# Patient Record
Sex: Male | Born: 1955 | Race: White | Hispanic: No | Smoking: Former smoker
Health system: Southern US, Community
[De-identification: ages and names within clinical notes are randomized; demographics above are authoritative.]

## PROBLEM LIST (undated history)

## (undated) DIAGNOSIS — IMO0001 Reserved for inherently not codable concepts without codable children: Secondary | ICD-10-CM

## (undated) DIAGNOSIS — Z87440 Personal history of urinary (tract) infections: Secondary | ICD-10-CM

## (undated) DIAGNOSIS — Z87442 Personal history of urinary calculi: Secondary | ICD-10-CM

## (undated) DIAGNOSIS — G245 Blepharospasm: Secondary | ICD-10-CM

## (undated) DIAGNOSIS — R238 Other skin changes: Secondary | ICD-10-CM

## (undated) DIAGNOSIS — R350 Frequency of micturition: Secondary | ICD-10-CM

## (undated) DIAGNOSIS — E119 Type 2 diabetes mellitus without complications: Secondary | ICD-10-CM

## (undated) DIAGNOSIS — E785 Hyperlipidemia, unspecified: Secondary | ICD-10-CM

## (undated) DIAGNOSIS — M1990 Unspecified osteoarthritis, unspecified site: Secondary | ICD-10-CM

## (undated) DIAGNOSIS — K219 Gastro-esophageal reflux disease without esophagitis: Secondary | ICD-10-CM

## (undated) DIAGNOSIS — M2550 Pain in unspecified joint: Secondary | ICD-10-CM

## (undated) DIAGNOSIS — N400 Enlarged prostate without lower urinary tract symptoms: Secondary | ICD-10-CM

## (undated) DIAGNOSIS — T8859XA Other complications of anesthesia, initial encounter: Secondary | ICD-10-CM

## (undated) DIAGNOSIS — T4145XA Adverse effect of unspecified anesthetic, initial encounter: Secondary | ICD-10-CM

## (undated) DIAGNOSIS — H5440 Blindness, one eye, unspecified eye: Secondary | ICD-10-CM

## (undated) HISTORY — DX: Unspecified osteoarthritis, unspecified site: M19.90

## (undated) HISTORY — DX: Type 2 diabetes mellitus without complications: E11.9

## (undated) HISTORY — PX: OTHER SURGICAL HISTORY: SHX169

## (undated) HISTORY — DX: Hyperlipidemia, unspecified: E78.5

## (undated) HISTORY — DX: Gastro-esophageal reflux disease without esophagitis: K21.9

## (undated) HISTORY — DX: Reserved for inherently not codable concepts without codable children: IMO0001

## (undated) HISTORY — PX: BLADDER SURGERY: SHX569

---

## 1998-08-22 HISTORY — PX: OTHER SURGICAL HISTORY: SHX169

## 2003-08-23 HISTORY — PX: BRAIN SURGERY: SHX531

## 2007-08-23 HISTORY — PX: TOTAL HIP ARTHROPLASTY: SHX124

## 2014-09-01 ENCOUNTER — Emergency Department: Payer: Self-pay | Admitting: Emergency Medicine

## 2014-09-01 LAB — BASIC METABOLIC PANEL
Anion Gap: 8 (ref 7–16)
BUN: 14 mg/dL (ref 7–18)
CALCIUM: 8.4 mg/dL — AB (ref 8.5–10.1)
CREATININE: 0.87 mg/dL (ref 0.60–1.30)
Chloride: 106 mmol/L (ref 98–107)
Co2: 25 mmol/L (ref 21–32)
GLUCOSE: 156 mg/dL — AB (ref 65–99)
Osmolality: 281 (ref 275–301)
POTASSIUM: 3.8 mmol/L (ref 3.5–5.1)
SODIUM: 139 mmol/L (ref 136–145)

## 2014-09-01 LAB — CBC WITH DIFFERENTIAL/PLATELET
Basophil #: 0.1 10*3/uL (ref 0.0–0.1)
Basophil %: 0.9 %
Eosinophil #: 0.4 10*3/uL (ref 0.0–0.7)
Eosinophil %: 3.8 %
HCT: 47.5 % (ref 40.0–52.0)
HGB: 16 g/dL (ref 13.0–18.0)
LYMPHS ABS: 2.8 10*3/uL (ref 1.0–3.6)
Lymphocyte %: 29.4 %
MCH: 29.1 pg (ref 26.0–34.0)
MCHC: 33.6 g/dL (ref 32.0–36.0)
MCV: 86 fL (ref 80–100)
Monocyte #: 0.7 x10 3/mm (ref 0.2–1.0)
Monocyte %: 7.4 %
Neutrophil #: 5.5 10*3/uL (ref 1.4–6.5)
Neutrophil %: 58.5 %
PLATELETS: 174 10*3/uL (ref 150–440)
RBC: 5.5 10*6/uL (ref 4.40–5.90)
RDW: 13.5 % (ref 11.5–14.5)
WBC: 9.4 10*3/uL (ref 3.8–10.6)

## 2014-12-11 ENCOUNTER — Ambulatory Visit
Admit: 2014-12-11 | Disposition: A | Discharge status: Home / Self Care | Payer: Self-pay | Attending: Pain Medicine | Admitting: Pain Medicine

## 2015-10-06 ENCOUNTER — Encounter: Payer: Self-pay | Admitting: Family Medicine

## 2015-10-07 ENCOUNTER — Encounter: Payer: Self-pay | Admitting: Obstetrics and Gynecology

## 2015-10-07 ENCOUNTER — Ambulatory Visit (INDEPENDENT_AMBULATORY_CARE_PROVIDER_SITE_OTHER): Payer: Medicare Other | Admitting: Obstetrics and Gynecology

## 2015-10-07 VITALS — BP 118/73 | HR 43 | Resp 16 | Ht 67.0 in | Wt 199.8 lb

## 2015-10-07 DIAGNOSIS — N401 Enlarged prostate with lower urinary tract symptoms: Secondary | ICD-10-CM | POA: Diagnosis not present

## 2015-10-07 DIAGNOSIS — R350 Frequency of micturition: Secondary | ICD-10-CM | POA: Diagnosis not present

## 2015-10-07 DIAGNOSIS — N138 Other obstructive and reflux uropathy: Secondary | ICD-10-CM

## 2015-10-07 DIAGNOSIS — Z87442 Personal history of urinary calculi: Secondary | ICD-10-CM

## 2015-10-07 DIAGNOSIS — N390 Urinary tract infection, site not specified: Secondary | ICD-10-CM

## 2015-10-07 LAB — URINALYSIS, COMPLETE
Bilirubin, UA: NEGATIVE
Ketones, UA: NEGATIVE
Nitrite, UA: NEGATIVE
SPEC GRAV UA: 1.025 (ref 1.005–1.030)
UUROB: 0.2 mg/dL (ref 0.2–1.0)
pH, UA: 5.5 (ref 5.0–7.5)

## 2015-10-07 LAB — BLADDER SCAN AMB NON-IMAGING

## 2015-10-07 LAB — MICROSCOPIC EXAMINATION
Bacteria, UA: NONE SEEN
EPITHELIAL CELLS (NON RENAL): NONE SEEN /HPF (ref 0–10)
RBC, UA: NONE SEEN /hpf (ref 0–?)

## 2015-10-07 MED ORDER — TAMSULOSIN HCL 0.4 MG PO CAPS: 0.4000 mg | ORAL_CAPSULE | Freq: Every day | ORAL | Status: DC

## 2015-10-07 NOTE — Progress Notes (Signed)
10/07/2015 1:41 PM   Alex Beltran [birth year] [id number]  Referring provider: No referring provider defined for this encounter.  Chief Complaint  Patient presents with  . Urinary Frequency  . Urinary Urgency  . Establish Care    HPI: Patient is a 60 year old male with a history of kidney/bladder stones, urinary tract infections, arthritis, hyperlipidemia and uncontrolled diabetes presented today as a referral from his primary care provider with urinary complaints include sensation of incomplete bladder emptying, urinary frequency, intermittency, urgency, weak stream, nocturia and having to strain to urinate. His current I-PSS is 29 with quality of life rating terrible.  He since last available hemoglobin A1c was 11.9.  Patient currently working with PCP for better control.  Urologic surgical history includes URS x 2 with stent placement and vasectomy.  Taking Cipro now  for suspected UTI prescribed by PCP.  Previous urologist in [facility].  Former smoker x 30 years.  07/14/15  Urine Culture + E. Coli HgbA1C 11.4 PSA 2.1  PMH: Past Medical History  Diagnosis Date  . Reflux   . Arthritis   . Diabetes mellitus (HCC)   . HLD (hyperlipidemia)     Surgical History: Past Surgical History  Procedure Laterality Date  . Total hip arthroplasty  2009  . Brain surgery  2005  . Bladder surgery  2013, 14, 15    stones    Home Medications:    Medication List       This list is accurate as of: 10/07/15  1:41 PM.  Always use your most recent med list.               celecoxib 100 MG capsule  Commonly known as:  CELEBREX  TK 1 C PO D FOR ARTHRITIS     ciprofloxacin 250 MG/5ML (5%) Susr  Commonly known as:  CIPRO  Take 250 mg by mouth 2 (two) times daily.     fenofibrate 145 MG tablet  Commonly known as:  TRICOR  TK 1 T PO D     glipiZIDE 5 MG 24 hr tablet  Commonly known as:  GLUCOTROL XL  TK 1 T PO D FOR DIABETES     insulin glargine 100 UNIT/ML injection   Commonly known as:  LANTUS  Inject into the skin at bedtime.     lisinopril 10 MG tablet  Commonly known as:  PRINIVIL,ZESTRIL  TK 1 T PO D FOR HIGH BP     metFORMIN 1000 MG tablet  Commonly known as:  GLUCOPHAGE  TK 1 T PO BID FOR DIABETES     omega-3 acid ethyl esters 1 g capsule  Commonly known as:  LOVAZA  TK 2 CS PO BID     rosuvastatin 20 MG tablet  Commonly known as:  CRESTOR  TK 1 T PO QD FOR HIGH CHOLESTEROL     tamsulosin 0.4 MG Caps capsule  Commonly known as:  FLOMAX  Take 1 capsule (0.4 mg total) by mouth daily.        Allergies: Not on File  Family History: Family History  Problem Relation Age of Onset  . Tuberculosis Mother   . Melanoma Mother     Social History:  reports that he quit smoking about 2 years ago. He does not have any smokeless tobacco history on file. He reports that he does not drink alcohol. His drug history is not on file.  ROS: UROLOGY Frequent Urination?: Yes Hard to postpone urination?: No Burning/pain with urination?: Yes Get up at night  to urinate?: Yes Leakage of urine?: No Urine stream starts and stops?: Yes Trouble starting stream?: Yes Do you have to strain to urinate?: No Blood in urine?: No Urinary tract infection?: Yes Sexually transmitted disease?: No Injury to kidneys or bladder?: No Painful intercourse?: No Weak stream?: No Erection problems?: Yes Penile pain?: No  Gastrointestinal Nausea?: No Vomiting?: No Indigestion/heartburn?: No Diarrhea?: No Constipation?: No  Constitutional Fever: No Night sweats?: No Weight loss?: No Fatigue?: No  Skin Skin rash/lesions?: No Itching?: No  Eyes Blurred vision?: No Double vision?: No  Ears/Nose/Throat Sore throat?: No Sinus problems?: No  Hematologic/Lymphatic Swollen glands?: No Easy bruising?: No  Cardiovascular Leg swelling?: Yes Chest pain?: No  Respiratory Cough?: No Shortness of breath?: No  Endocrine Excessive thirst?:  No  Musculoskeletal Back pain?: No Joint pain?: No  Neurological Headaches?: No Dizziness?: No  Psychologic Depression?: No Anxiety?: No  Physical Exam: BP 118/73 mmHg  Pulse 43  Resp 16  Ht  (1.702 m)  Wt 199 lb 12.8 oz (90.629 kg)  BMI 31.29 kg/m2  Constitutional:  Alert and oriented, No acute distress. HEENT: [place] AT, moist mucus membranes.  Trachea midline, no masses. Cardiovascular: No clubbing, cyanosis, or edema. Respiratory: Normal respiratory effort, no increased work of breathing. GI: Abdomen is soft, nontender, nondistended, multiple abdominal hernias GU: No CVA tenderness.  Uncircumcised phallus with easily retractable foreskin, small amount of smegma lining coronal border, testicles descended bilaterally without palpable masses or significant tenderness. DRE: Prostate is enlarged +2-3 without palpable nodules though exam was limited due to patient compliance Skin: No rashes, bruises or suspicious lesions. Lymph: No cervical or inguinal adenopathy. Neurologic: Grossly intact, no focal deficits, moving all 4 extremities. Psychiatric: Normal mood and affect.  Laboratory Data:  Lab Results  Component Value Date   WBC 9.4 09/01/2014   HGB 16.0 09/01/2014   HCT 47.5 09/01/2014   MCV 86 09/01/2014   PLT 174 09/01/2014    Lab Results  Component Value Date   CREATININE 0.87 09/01/2014    No results found for: PSA  No results found for: TESTOSTERONE  No results found for: HGBA1C  Urinalysis >30 WBC RBC-0  Calcium oxalate crystals, no bacteria  Pertinent Imaging:   Assessment & Plan:    1. BPH with LUTS-   +2-3 on exam smooth but limited d/t patient compliance.  Start Flomax.  Recheck PVR when patient f/u for CT results. - Urinalysis, Complete - BLADDER SCAN AMB NON-IMAGING  2. Recurrent UTI- Good hygiene practices reviewed. Possibly recurrent bladder stones.  3. H/O kidney/bladder stones-  -CT Renal Stone Study   Return for CT results/  recheck PVR.  These notes generated with voice recognition software. I apologize for typographical errors.  Earlie Lou, FNP  [facility] [address], [address] [place], [place] [address] [phone]

## 2015-10-16 ENCOUNTER — Ambulatory Visit
Admission: RE | Admit: 2015-10-16 | Discharge: 2015-10-16 | Disposition: A | Discharge status: Home / Self Care | Payer: Medicare Other | Source: Ambulatory Visit | Attending: Obstetrics and Gynecology | Admitting: Obstetrics and Gynecology

## 2015-10-16 DIAGNOSIS — K862 Cyst of pancreas: Secondary | ICD-10-CM | POA: Insufficient documentation

## 2015-10-16 DIAGNOSIS — N401 Enlarged prostate with lower urinary tract symptoms: Secondary | ICD-10-CM

## 2015-10-16 DIAGNOSIS — K8689 Other specified diseases of pancreas: Secondary | ICD-10-CM | POA: Insufficient documentation

## 2015-10-16 DIAGNOSIS — M6259 Muscle wasting and atrophy, not elsewhere classified, multiple sites: Secondary | ICD-10-CM | POA: Diagnosis not present

## 2015-10-16 DIAGNOSIS — K5730 Diverticulosis of large intestine without perforation or abscess without bleeding: Secondary | ICD-10-CM | POA: Diagnosis not present

## 2015-10-16 DIAGNOSIS — M1612 Unilateral primary osteoarthritis, left hip: Secondary | ICD-10-CM | POA: Insufficient documentation

## 2015-10-16 DIAGNOSIS — N3289 Other specified disorders of bladder: Secondary | ICD-10-CM | POA: Insufficient documentation

## 2015-10-16 DIAGNOSIS — I2510 Atherosclerotic heart disease of native coronary artery without angina pectoris: Secondary | ICD-10-CM | POA: Insufficient documentation

## 2015-10-16 DIAGNOSIS — J439 Emphysema, unspecified: Secondary | ICD-10-CM | POA: Diagnosis not present

## 2015-10-16 DIAGNOSIS — N390 Urinary tract infection, site not specified: Secondary | ICD-10-CM | POA: Diagnosis present

## 2015-10-16 DIAGNOSIS — N138 Other obstructive and reflux uropathy: Secondary | ICD-10-CM

## 2015-10-16 DIAGNOSIS — Z87442 Personal history of urinary calculi: Secondary | ICD-10-CM

## 2015-10-16 DIAGNOSIS — R599 Enlarged lymph nodes, unspecified: Secondary | ICD-10-CM | POA: Diagnosis not present

## 2015-10-16 DIAGNOSIS — K439 Ventral hernia without obstruction or gangrene: Secondary | ICD-10-CM | POA: Diagnosis not present

## 2015-10-16 DIAGNOSIS — K5710 Diverticulosis of small intestine without perforation or abscess without bleeding: Secondary | ICD-10-CM | POA: Insufficient documentation

## 2015-10-16 DIAGNOSIS — R350 Frequency of micturition: Secondary | ICD-10-CM

## 2015-10-22 ENCOUNTER — Ambulatory Visit (INDEPENDENT_AMBULATORY_CARE_PROVIDER_SITE_OTHER): Payer: Medicare Other | Admitting: Obstetrics and Gynecology

## 2015-10-22 ENCOUNTER — Encounter: Payer: Self-pay | Admitting: Obstetrics and Gynecology

## 2015-10-22 VITALS — BP 122/73 | HR 67 | Resp 16 | Ht 67.0 in | Wt 207.9 lb

## 2015-10-22 DIAGNOSIS — N390 Urinary tract infection, site not specified: Secondary | ICD-10-CM

## 2015-10-22 LAB — URINALYSIS, COMPLETE
Bilirubin, UA: NEGATIVE
Ketones, UA: NEGATIVE
NITRITE UA: NEGATIVE
Specific Gravity, UA: >1.03 — ABNORMAL HIGH (ref 1.005–1.030)
Urobilinogen, Ur: 0.2 mg/dL (ref 0.2–1.0)
pH, UA: 5.5 (ref 5.0–7.5)

## 2015-10-22 LAB — MICROSCOPIC EXAMINATION
Bacteria, UA: NONE SEEN
EPITHELIAL CELLS (NON RENAL): NONE SEEN /HPF (ref 0–10)
WBC, UA: >30 /hpf — AB (ref 0–?)

## 2015-10-22 LAB — BLADDER SCAN AMB NON-IMAGING

## 2015-10-22 MED ORDER — FINASTERIDE 5 MG PO TABS: 5.0000 mg | ORAL_TABLET | Freq: Every day | ORAL | Status: DC

## 2015-10-22 NOTE — Progress Notes (Signed)
10:42 AM   Alex Beltran May 31, 1956 [id number]  Referring provider: Oswaldo Conroy, MD [address] [place], [place] [address]  Chief Complaint  Patient presents with  . Recurrent UTI  . Follow-up    HPI: Patient is a 60 year old male with a history of kidney/bladder stones, urinary tract infections, arthritis, hyperlipidemia and uncontrolled diabetes presented today as a referral from his primary care provider with urinary complaints include sensation of incomplete bladder emptying, urinary frequency, intermittency, urgency, weak stream, nocturia and having to strain to urinate. His current I-PSS is 29 with quality of life rating terrible.  He since last available hemoglobin A1c was 11.9.  Patient currently working with PCP for better control.  Urologic surgical history includes URS x 2 with stent placement and vasectomy.  Taking Cipro now  for suspected UTI prescribed by PCP.  Previous urologist in [facility].  Former smoker x 30 years.  07/14/15  Urine Culture + E. Coli HgbA1C 11.4 PSA 2.1  Interval History:   Improvement in hesistancy, intermittency and nocturia.  Patient presents today to review his CT scan report.  PMH: Past Medical History  Diagnosis Date  . Reflux   . Arthritis   . Diabetes mellitus (HCC)   . HLD (hyperlipidemia)     Surgical History: Past Surgical History  Procedure Laterality Date  . Total hip arthroplasty  2009  . Brain surgery  2005  . Bladder surgery  2013, 14, 15    stones    Home Medications:    Medication List       This list is accurate as of: 10/22/15 11:59 PM.  Always use your most recent med list.               celecoxib 100 MG capsule  Commonly known as:  CELEBREX  TK 1 C PO D FOR ARTHRITIS     fenofibrate 145 MG tablet  Commonly known as:  TRICOR  TK 1 T PO D     finasteride 5 MG tablet  Commonly known as:  PROSCAR  Take 1 tablet (5 mg total) by mouth daily.     glipiZIDE 10 MG tablet   Commonly known as:  GLUCOTROL  Take 10 mg by mouth daily before breakfast.     insulin glargine 100 UNIT/ML injection  Commonly known as:  LANTUS  Inject 40 Units into the skin at bedtime.     lisinopril 10 MG tablet  Commonly known as:  PRINIVIL,ZESTRIL  TK 1 T PO D FOR HIGH BP     metFORMIN 1000 MG tablet  Commonly known as:  GLUCOPHAGE  TK 1 T PO BID FOR DIABETES     omega-3 acid ethyl esters 1 g capsule  Commonly known as:  LOVAZA  TK 2 CS PO BID     rosuvastatin 20 MG tablet  Commonly known as:  CRESTOR  TK 1 T PO QD FOR HIGH CHOLESTEROL     tamsulosin 0.4 MG Caps capsule  Commonly known as:  FLOMAX  Take 1 capsule (0.4 mg total) by mouth daily.        Allergies:  Allergies  Allergen Reactions  . Morphine And Related Nausea And Vomiting  . Niacin And Related Other (See Comments)    tachycardia    Family History: Family History  Problem Relation Age of Onset  . Tuberculosis Mother   . Melanoma Mother     Social History:  reports that he quit smoking about 2 years ago. He does  not have any smokeless tobacco history on file. He reports that he does not drink alcohol. His drug history is not on file.  ROS: UROLOGY Frequent Urination?: No Hard to postpone urination?: No Burning/pain with urination?: No Get up at night to urinate?: No Leakage of urine?: No Urine stream starts and stops?: No Trouble starting stream?: No Do you have to strain to urinate?: No Blood in urine?: No Urinary tract infection?: No Sexually transmitted disease?: No Injury to kidneys or bladder?: No Painful intercourse?: No Weak stream?: No Erection problems?: No Penile pain?: No  Gastrointestinal Nausea?: No Vomiting?: No Indigestion/heartburn?: No Diarrhea?: No Constipation?: No  Constitutional Fever: No Night sweats?: No Weight loss?: No Fatigue?: No  Skin Skin rash/lesions?: No Itching?: No  Eyes Blurred vision?: No Double vision?:  No  Ears/Nose/Throat Sore throat?: No Sinus problems?: No  Hematologic/Lymphatic Swollen glands?: No Easy bruising?: No  Cardiovascular Leg swelling?: No Chest pain?: No  Respiratory Cough?: No Shortness of breath?: No  Endocrine Excessive thirst?: No  Musculoskeletal Back pain?: No Joint pain?: No  Neurological Headaches?: No Dizziness?: No  Psychologic Depression?: No Anxiety?: No  Physical Exam: BP 122/73 mmHg  Pulse 67  Resp 16  Ht  (1.702 m)  Wt 207 lb 14.4 oz (94.303 kg)  BMI 32.55 kg/m2  Constitutional:  Alert and oriented, No acute distress. HEENT: [place] AT, moist mucus membranes.  Trachea midline, no masses. Cardiovascular: No clubbing, cyanosis, or edema. Respiratory: Normal respiratory effort, no increased work of breathing. Skin: No rashes, bruises or suspicious lesions. Neurologic: Grossly intact, no focal deficits, moving all 4 extremities. Psychiatric: Normal mood and affect.  Laboratory Data:  Lab Results  Component Value Date   WBC 9.4 09/01/2014   HGB 16.0 09/01/2014   HCT 47.5 09/01/2014   MCV 86 09/01/2014   PLT 174 09/01/2014    Lab Results  Component Value Date   CREATININE 0.87 09/01/2014    No results found for: PSA  No results found for: TESTOSTERONE  No results found for: HGBA1C  Urinalysis >30 WBC RBC-0  Calcium oxalate crystals, no bacteria  Pertinent Imaging: CLINICAL DATA: Renal calculi. Nephrolithiasis. Stent placements in the past. Benign prostatic hypertrophy.  EXAM: CT ABDOMEN AND PELVIS WITHOUT CONTRAST  TECHNIQUE: Multidetector CT imaging of the abdomen and pelvis was performed following the standard protocol without IV contrast.  COMPARISON: None.  FINDINGS: Lower chest: Emphysema. Volume loss and scarring peripherally in the right lower lobe, in the right middle lobe, and posteriorly in the lingula. Right coronary artery atherosclerotic calcification.  Hepatobiliary: Several small  right hepatic lobe lesions occlude a 5 mm hypodensity anteriorly in the right hepatic lobe on image 20 series 2 and a 4 mm lesion along the dome of the right hepatic lobe on image 18 series 2. These are technically nonspecific. Mildly contracted gallbladder.  Pancreas: Cystic potentially partially exophytic 3.3 by 1.9 by 2.0 cm lesion along the uncinate process of the pancreas. Along the upper margin of this lesion, there is a 5 mm punctate calcification (image 39, series 2). There also some questionable punctate calcifications along the inferior margin of the pancreatic head, images 82-84 of series 5. In addition, there is an adjacent periampullary duodenal diverticulum. It is difficult to follow the distal common bile duct but I suspect that the calcification in question is 2 medial to the within the CBD, although it could be in the ventral pancreatic duct. There is no dilatation of the dorsal pancreatic duct.  Spleen: Unremarkable  Adrenals/Urinary Tract: There is a 10 mm calcification along the posterior margin of the urinary bladder, probably indolent intraluminal although somewhat obscured by the streak artifact from the adjacent right hip implant. This could be in the right UVJ, intraluminal, or less likely extraluminal along the bladder wall. There is no hydronephrosis or hydroureter and no other urinary tract calculi are observed.  Stomach/Bowel: Duodenal diverticula in the periampullary and third portion. Descending colon and sigmoid colon diverticulosis.  Vascular/Lymphatic: 1.2 cm left external iliac node, image 85 series 2. Additional bilateral external iliac nodes have large fatty hila.  Aortoiliac atherosclerotic vascular disease.  Reproductive: Small punctate calcification in the central prostate the zone.  Other: No supplemental non-categorized findings.  Musculoskeletal: Atrophic right iliopsoas muscles. Atrophic right hip adductor musculature.  Right hip implant noted with the screw fixators of the acetabular shell mostly projecting into the right gluteus minimus muscle with minimal extension through the iliac bone.  Intervertebral spurring at L4-5. Small central disc protrusion at L5-S1. Moderate to prominent degenerative loss of articular space in the left hip.  Small umbilical hernia contains omental adipose tissue. Several adjacent supraumbilical hernias in the midline noted on images 39-42 of series 2, each with a 1.6 cm hernia neck, and with herniated adipose tissue in this vicinity measuring approximately 4.2 by 2.0 by 8.2 cm.  There is a left upper quadrant ventral hernia extending below the rib cage, 2 interposed itself between the external oblique muscle and the rib cage as shown on image 33 series 2. This hernia contains adipose tissue.  IMPRESSION: 1. 10 mm calcification along the right posterior margin of the urinary bladder, probably intraluminal, possibly in the right UVJ. There is no hydronephrosis to further suggest a UVJ location. Region obscured by streak artifact from the patient's right hip implant. 2. 3.3 by 1.9 by 2.0 cm cystic lesion along the uncinate margin of the pancreas with a marginal 5 mm calcification. Pancreatic protocol MRI with and without contrast recommended for further assessment. 3. Multiple ventral hernias as detailed above. Atrophic right iliopsoas muscles. Atrophic right hip adductor musculature. 4. The screw fixators of the acetabular shell component of the right hip implant mostly project in the right gluteus minimus muscle, with minimal extension through the iliac bone. 5. Moderate to prominent degenerative chondral thinning in the left hip. 6. Mildly enlarged left external iliac node, nonspecific for reactive versus neoplastic etiology. 7. Duodenal diverticula are present with 1 PICC periampullary in location and the other along the third portion of the duodenum. 8.  Emphysema. 9. Coronary atherosclerosis. 10. Several small right hepatic lobe hypodensities are statistically likely to be benign but technically nonspecific. 11. Descending colon and sigmoid colon diverticulosis.    Assessment & Plan:    1. BPH with LUTS-   +2-3 on exam smooth but limited d/t patient compliance.  Continue Flomax.  Start Finasteride today.  May consider anticholinergics in the future should presumed bladder spasms continued. - Urinalysis, Complete - BLADDER SCAN AMB NON-IMAGING  2. Recurrent UTI- Resolved.  UA unremarkable today.  3. H/O kidney/bladder stones-  No renal or ureteral stones visualized. -CT Renal Stone Study 10 mm calcification along the right posterior margin of the urinary bladder, probably intraluminal, possibly in the right UVJ. There is no hydronephrosis to further suggest a UVJ location. Region obscured by streak artifact from the patient's right hip implant.   -CT images reviewed with Dr. Apolinar Junes who recommended cystoscopy for further evaluation of calcification.  I reviewed multiple incidental findings on  patient's CT report and recommended that he follow-up with his primary care provider. As provided a copy of his CT report to take with him to his follow-up appointment.   Return in about 3 months (around 01/22/2016) for PSA/DRE/I-PSS with shannon.  These notes generated with voice recognition software. I apologize for typographical errors.  Earlie Lou, FNP  [facility] [address] [place], [place] [address] [phone]

## 2015-10-25 LAB — CULTURE, URINE COMPREHENSIVE

## 2015-10-26 ENCOUNTER — Other Ambulatory Visit: Payer: Self-pay | Admitting: Obstetrics and Gynecology

## 2015-10-26 ENCOUNTER — Telehealth: Payer: Self-pay

## 2015-10-26 DIAGNOSIS — N390 Urinary tract infection, site not specified: Secondary | ICD-10-CM

## 2015-10-26 MED ORDER — NITROFURANTOIN MONOHYD MACRO 100 MG PO CAPS: 100.0000 mg | ORAL_CAPSULE | Freq: Two times a day (BID) | ORAL | Status: DC

## 2015-10-26 NOTE — Telephone Encounter (Signed)
-----   Message from Fernanda DrumLindsay C Overton, FNP sent at 10/26/2015  8:30 AM EST ----- Please notify patient that his urine culture was positive for infection.  I sent in a prescription for Macrobid for him to start.  He needs to follow up in about 2 weeks to recheck his urine. thanks

## 2015-10-26 NOTE — Telephone Encounter (Signed)
Spoke with pt and made aware of +ucx. Made aware abx has been sent to pharmacy. Pt voiced understanding. Lab appt made for repeat urine.

## 2015-10-29 ENCOUNTER — Telehealth: Payer: Self-pay | Admitting: Obstetrics and Gynecology

## 2015-10-29 NOTE — Telephone Encounter (Signed)
Please notify patient that I reviewed his CT scan images with Dr. Apolinar JunesBrandon. The calcification that I discussed with him that was seen on his CT scan and his last visit could possibly be in his ureter or bladder. Dr. Apolinar JunesBrandon recommended that we perform a cystoscopy for further evaluation. I would like for him to be scheduled for this. Please mail him information if he would like. If he has any further questions or would be happy to speak with him.  Thanks

## 2015-10-30 NOTE — Telephone Encounter (Signed)
Spoke with pt in reference to CT and cysto. Pt was in agreeable. Pt inquired about needing to stay on abx until cysto. Please advise.

## 2015-11-09 ENCOUNTER — Other Ambulatory Visit: Payer: Medicare Other

## 2015-11-09 DIAGNOSIS — N390 Urinary tract infection, site not specified: Secondary | ICD-10-CM

## 2015-11-09 LAB — URINALYSIS, COMPLETE
Bilirubin, UA: NEGATIVE
Ketones, UA: NEGATIVE
Nitrite, UA: NEGATIVE
PH UA: 5 (ref 5.0–7.5)
Specific Gravity, UA: 1.025 (ref 1.005–1.030)
Urobilinogen, Ur: 0.2 mg/dL (ref 0.2–1.0)

## 2015-11-09 LAB — MICROSCOPIC EXAMINATION: EPITHELIAL CELLS (NON RENAL): NONE SEEN /HPF (ref 0–10)

## 2015-11-15 LAB — CULTURE, URINE COMPREHENSIVE

## 2015-11-16 ENCOUNTER — Telehealth: Payer: Self-pay

## 2015-11-16 DIAGNOSIS — N390 Urinary tract infection, site not specified: Secondary | ICD-10-CM

## 2015-11-16 MED ORDER — TETRACYCLINE HCL 500 MG PO CAPS: 500.0000 mg | ORAL_CAPSULE | Freq: Two times a day (BID) | ORAL | Status: DC

## 2015-11-16 NOTE — Telephone Encounter (Signed)
See previous note

## 2015-11-16 NOTE — Telephone Encounter (Signed)
Patient has a +UCx. They need to start tetracycline 500 mg, one twice daily. He has a cystoscopy scheduled for Tuesday. He will not be on the antibiotic long enough. I suggest rescheduling the cystoscopy for next week and staying on the antibiotic until then.  Medication sent to pharmacy and pt made aware. Pt was transferred to the front to reschedule cysto appt.

## 2015-11-16 NOTE — Telephone Encounter (Signed)
-----   Message from Harle BattiestShannon A McGowan, PA-C sent at 11/15/2015  2:32 PM EDT ----- Patient has a +UCx.  They need to start tetracycline 500 mg,  one twice daily.  He has a cystoscopy scheduled for Tuesday.  He will not be on the antibiotic long enough.  I suggest rescheduling the cystoscopy for next week and staying on the antibiotic until then.

## 2015-11-17 ENCOUNTER — Other Ambulatory Visit: Payer: Medicare Other

## 2015-12-01 ENCOUNTER — Encounter: Payer: Self-pay | Admitting: Urology

## 2015-12-01 ENCOUNTER — Ambulatory Visit (INDEPENDENT_AMBULATORY_CARE_PROVIDER_SITE_OTHER): Payer: Medicare Other | Admitting: Urology

## 2015-12-01 VITALS — BP 142/81 | HR 63 | Ht 67.0 in | Wt 205.7 lb

## 2015-12-01 DIAGNOSIS — N400 Enlarged prostate without lower urinary tract symptoms: Secondary | ICD-10-CM | POA: Diagnosis not present

## 2015-12-01 DIAGNOSIS — N210 Calculus in bladder: Secondary | ICD-10-CM | POA: Diagnosis not present

## 2015-12-01 DIAGNOSIS — N390 Urinary tract infection, site not specified: Secondary | ICD-10-CM | POA: Diagnosis not present

## 2015-12-01 DIAGNOSIS — R3129 Other microscopic hematuria: Secondary | ICD-10-CM

## 2015-12-01 LAB — MICROSCOPIC EXAMINATION: BACTERIA UA: NONE SEEN

## 2015-12-01 LAB — URINALYSIS, COMPLETE
BILIRUBIN UA: NEGATIVE
LEUKOCYTES UA: NEGATIVE
Nitrite, UA: NEGATIVE
PH UA: 6 (ref 5.0–7.5)
PROTEIN UA: NEGATIVE
RBC UA: NEGATIVE
Specific Gravity, UA: 1.025 (ref 1.005–1.030)
Urobilinogen, Ur: 1 mg/dL (ref 0.2–1.0)

## 2015-12-01 MED ORDER — LIDOCAINE HCL 2 % EX GEL
1.0000 "application " | Freq: Once | CUTANEOUS | Status: AC
  Administered 2015-12-01: 1 via URETHRAL

## 2015-12-01 MED ORDER — CIPROFLOXACIN HCL 500 MG PO TABS
500.0000 mg | ORAL_TABLET | Freq: Once | ORAL | Status: AC
  Administered 2015-12-01: 500 mg via ORAL

## 2015-12-01 NOTE — Progress Notes (Signed)
    Cystoscopy Procedure Note  Patient identification was confirmed, informed consent was obtained, and patient was prepped using Betadine solution.  Lidocaine jelly was administered per urethral meatus.    Preoperative abx where received prior to procedure.     Pre-Procedure: - Inspection reveals a normal caliber ureteral meatus.  Procedure: The flexible cystoscope was introduced without difficulty - No urethral strictures/lesions are present. - Enlarged prostate bilobar hypertrophy - Elevated bladder neck - Bilateral ureteral orifices identified - Bladder mucosa  reveals no ulcers, tumors, or lesions - 1.2cm bladder stones - No trabeculation  Retroflexion shows no intravesical prostatic protrusion   Post-Procedure: - Patient tolerated the procedure well  Plan: Schedule for cystolithalopaxy and Urolift

## 2016-01-04 ENCOUNTER — Other Ambulatory Visit: Payer: Self-pay

## 2016-01-04 DIAGNOSIS — N390 Urinary tract infection, site not specified: Secondary | ICD-10-CM

## 2016-01-05 ENCOUNTER — Other Ambulatory Visit: Payer: Medicare Other

## 2016-01-05 DIAGNOSIS — N390 Urinary tract infection, site not specified: Secondary | ICD-10-CM

## 2016-01-05 LAB — URINALYSIS, COMPLETE
Bilirubin, UA: NEGATIVE
Ketones, UA: NEGATIVE
Nitrite, UA: POSITIVE — AB
PH UA: 5.5 (ref 5.0–7.5)
Specific Gravity, UA: 1.025 (ref 1.005–1.030)
Urobilinogen, Ur: 1 mg/dL (ref 0.2–1.0)

## 2016-01-05 LAB — MICROSCOPIC EXAMINATION
RBC, UA: NONE SEEN /hpf (ref 0–?)
WBC, UA: >30 /hpf — ABNORMAL HIGH (ref 0–?)

## 2016-01-08 ENCOUNTER — Other Ambulatory Visit: Payer: Self-pay | Admitting: Urology

## 2016-01-09 LAB — CULTURE, URINE COMPREHENSIVE

## 2016-01-11 ENCOUNTER — Telehealth: Payer: Self-pay

## 2016-01-11 DIAGNOSIS — N390 Urinary tract infection, site not specified: Secondary | ICD-10-CM

## 2016-01-11 MED ORDER — NITROFURANTOIN MONOHYD MACRO 100 MG PO CAPS: 100.0000 mg | ORAL_CAPSULE | Freq: Two times a day (BID) | ORAL | Status: DC

## 2016-01-11 NOTE — Telephone Encounter (Signed)
-----   Message from Harle BattiestShannon A McGowan, PA-C sent at 01/10/2016  6:13 PM EDT ----- Patient has a +UCx.  They need to start Macrobid 100 mg,  one capsules twice daily for seven days.  Patient has an appointment with me on June 1st.

## 2016-01-11 NOTE — Telephone Encounter (Signed)
Spoke with pt in reference +ucx. Made aware abx has been sent to pharmacy. Pt voiced understanding.

## 2016-01-21 ENCOUNTER — Ambulatory Visit: Payer: Medicare Other | Admitting: Urology

## 2016-01-22 NOTE — Progress Notes (Signed)
CMET, LIPID PANEL 01-13-16 [facility] ON CHART

## 2016-01-22 NOTE — Patient Instructions (Addendum)
Alex Beltran  01/22/2016   Your procedure is scheduled on: 02-04-16  [facility]  [facility]  [facility] to 3rd floor to  Short Stay Center at 530  AM.  Call this number if you have problems the morning of surgery [phone]   Remember: ONLY 1 PERSON MAY GO WITH YOU TO SHORT STAY TO GET  READY MORNING OF YOUR SURGERY.  Do not eat food or drink liquids :After Midnight.     Take these medicines the morning of surgery with A SIP OF WATER:   TAKE 1/2 DOSE BEDTIME LANTUS INSULIN ON 02-03-16, TAMSULOSIN (FLOMAX) DO NOT TAKE ANY DIABETIC MEDICATIONS DAY OF YOUR SURGERY                               You may not have any metal on your body including hair pins and              piercings  Do not wear jewelry, make-up, lotions, powders or perfumes, deodorant             Do not wear nail polish.  Do not shave  48 hours prior to surgery.              Men may shave face and neck.   Do not bring valuables to the hospital. [place] IS NOT             RESPONSIBLE   FOR VALUABLES.  Contacts, dentures or bridgework may not be worn into surgery.  Leave suitcase in the car. After surgery it may be brought to your room.     Patients discharged the day of surgery will not be allowed to drive home.  Name and phone number of your driver: Amalia GreenhouseLINDA YOUNG GIRLFRIEND CELL [phone]  Special Instructions: N/A              Please read over the following fact sheets you were given: _____________________________________________________________________             [facility] - Preparing for [facility] Before surgery, you can play an important role.  Because skin is not sterile, your skin needs to be as free of germs as possible.  You can reduce the number of germs on your skin by washing with CHG (chlorahexidine gluconate) soap before surgery.  CHG is an antiseptic cleaner which kills germs and bonds with the skin to continue killing germs even after washing. Please  DO NOT use if you have an allergy to CHG or antibacterial soaps.  If your skin becomes reddened/irritated stop using the CHG and inform your nurse when you arrive at Short Stay. Do not shave (including legs and underarms) for at least 48 hours prior to the first CHG shower.  You may shave your face/neck. Please follow these instructions carefully:  1.  Shower with CHG Soap the night before surgery and the  morning of Surgery.  2.  If you choose to wash your hair, wash your hair first as usual with your  normal  shampoo.  3.  After you shampoo, rinse your hair and body thoroughly to remove the  shampoo.                           4.  Use CHG as you would any other  liquid soap.  You can apply chg directly  to the skin and wash                       Gently with a scrungie or clean washcloth.  5.  Apply the CHG Soap to your body ONLY FROM THE NECK DOWN.   Do not use on face/ open                           Wound or open sores. Avoid contact with eyes, ears mouth and genitals (private parts).                       Wash face,  Genitals (private parts) with your normal soap.             6.  Wash thoroughly, paying special attention to the area where your surgery  will be performed.  7.  Thoroughly rinse your body with warm water from the neck down.  8.  DO NOT shower/wash with your normal soap after using and rinsing off  the CHG Soap.                9.  Pat yourself dry with a clean towel.            10.  Wear clean pajamas.            11.  Place clean sheets on your bed the night of your first shower and do not  sleep with pets. Day of Surgery : Do not apply any lotions/deodorants the morning of surgery.  Please wear clean clothes to the hospital/surgery center.  FAILURE TO FOLLOW THESE INSTRUCTIONS MAY RESULT IN THE CANCELLATION OF YOUR SURGERY PATIENT SIGNATURE_________________________________  NURSE  SIGNATURE__________________________________  ________________________________________________________________________

## 2016-01-26 ENCOUNTER — Encounter (HOSPITAL_COMMUNITY): Payer: Self-pay

## 2016-01-26 ENCOUNTER — Encounter (HOSPITAL_COMMUNITY)
Admission: RE | Admit: 2016-01-26 | Discharge: 2016-01-26 | Disposition: A | Discharge status: Home / Self Care | Payer: Medicare Other | Source: Ambulatory Visit | Attending: Urology | Admitting: Urology

## 2016-01-26 ENCOUNTER — Other Ambulatory Visit: Payer: Self-pay

## 2016-01-26 DIAGNOSIS — N400 Enlarged prostate without lower urinary tract symptoms: Secondary | ICD-10-CM | POA: Insufficient documentation

## 2016-01-26 DIAGNOSIS — E119 Type 2 diabetes mellitus without complications: Secondary | ICD-10-CM | POA: Diagnosis not present

## 2016-01-26 DIAGNOSIS — Z794 Long term (current) use of insulin: Secondary | ICD-10-CM | POA: Insufficient documentation

## 2016-01-26 DIAGNOSIS — Z79899 Other long term (current) drug therapy: Secondary | ICD-10-CM | POA: Diagnosis not present

## 2016-01-26 DIAGNOSIS — N390 Urinary tract infection, site not specified: Secondary | ICD-10-CM

## 2016-01-26 DIAGNOSIS — Z01818 Encounter for other preprocedural examination: Secondary | ICD-10-CM | POA: Diagnosis present

## 2016-01-26 DIAGNOSIS — Z01812 Encounter for preprocedural laboratory examination: Secondary | ICD-10-CM | POA: Diagnosis not present

## 2016-01-26 DIAGNOSIS — E785 Hyperlipidemia, unspecified: Secondary | ICD-10-CM | POA: Insufficient documentation

## 2016-01-26 DIAGNOSIS — R001 Bradycardia, unspecified: Secondary | ICD-10-CM | POA: Diagnosis not present

## 2016-01-26 DIAGNOSIS — N210 Calculus in bladder: Secondary | ICD-10-CM | POA: Diagnosis not present

## 2016-01-26 HISTORY — DX: Adverse effect of unspecified anesthetic, initial encounter: T41.45XA

## 2016-01-26 HISTORY — DX: Personal history of urinary (tract) infections: Z87.440

## 2016-01-26 HISTORY — DX: Personal history of urinary calculi: Z87.442

## 2016-01-26 HISTORY — DX: Other complications of anesthesia, initial encounter: T88.59XA

## 2016-01-26 HISTORY — DX: Other skin changes: R23.8

## 2016-01-26 HISTORY — DX: Benign prostatic hyperplasia without lower urinary tract symptoms: N40.0

## 2016-01-26 HISTORY — DX: Blindness, one eye, unspecified eye: H54.40

## 2016-01-26 HISTORY — DX: Pain in unspecified joint: M25.50

## 2016-01-26 HISTORY — DX: Frequency of micturition: R35.0

## 2016-01-26 LAB — CBC
HEMATOCRIT: 42.4 % (ref 39.0–52.0)
HEMOGLOBIN: 14.6 g/dL (ref 13.0–17.0)
MCH: 28.6 pg (ref 26.0–34.0)
MCHC: 34.4 g/dL (ref 30.0–36.0)
MCV: 83 fL (ref 78.0–100.0)
Platelets: 170 10*3/uL (ref 150–400)
RBC: 5.11 MIL/uL (ref 4.22–5.81)
RDW: 12.6 % (ref 11.5–15.5)
WBC: 7.2 10*3/uL (ref 4.0–10.5)

## 2016-01-26 NOTE — Progress Notes (Signed)
SMALL RED AREA RIGHT CHEST, PT TO CALL DR MCKENZIE AND MAKE AWARE UTI SYMPTOMS RETURNING AN DRED AREA ON CHEST X 2 DAYS

## 2016-01-27 ENCOUNTER — Other Ambulatory Visit: Payer: Medicare Other

## 2016-01-27 DIAGNOSIS — N390 Urinary tract infection, site not specified: Secondary | ICD-10-CM

## 2016-01-27 LAB — URINALYSIS, COMPLETE
BILIRUBIN UA: NEGATIVE
KETONES UA: NEGATIVE
NITRITE UA: POSITIVE — AB
SPEC GRAV UA: 1.025 (ref 1.005–1.030)
Urobilinogen, Ur: 0.2 mg/dL (ref 0.2–1.0)
pH, UA: 5.5 (ref 5.0–7.5)

## 2016-01-27 LAB — HEMOGLOBIN A1C
Hgb A1c MFr Bld: 8.9 % — ABNORMAL HIGH (ref 4.8–5.6)
Mean Plasma Glucose: 209 mg/dL

## 2016-01-27 LAB — MICROSCOPIC EXAMINATION
EPITHELIAL CELLS (NON RENAL): NONE SEEN /HPF (ref 0–10)
WBC, UA: >30 /hpf — AB (ref 0–?)

## 2016-01-27 NOTE — Progress Notes (Signed)
Faxed heamglobin A1C TO DR MCKENZIE BY [facility] AND LEFT MESSAGE WITH SELITA AT [facility].

## 2016-01-31 LAB — CULTURE, URINE COMPREHENSIVE

## 2016-02-01 ENCOUNTER — Telehealth: Payer: Self-pay

## 2016-02-01 DIAGNOSIS — N390 Urinary tract infection, site not specified: Secondary | ICD-10-CM

## 2016-02-01 MED ORDER — TETRACYCLINE HCL 500 MG PO CAPS: 500.0000 mg | ORAL_CAPSULE | Freq: Four times a day (QID) | ORAL | Status: DC

## 2016-02-01 NOTE — Telephone Encounter (Signed)
Spoke with pt in reference to +ucx. Made aware abx were sent to pharmacy. Pt voiced understanding. [facility].

## 2016-02-01 NOTE — Telephone Encounter (Signed)
-----   Message from Harle BattiestShannon A McGowan, PA-C sent at 02/01/2016 12:08 AM EDT ----- Patient will need to start tetracycline, 500 mg twice daily for 7 days. I believe the patient has an upcoming surgical procedure with Dr. Ronne BinningMcKenzie, so please notify Dr. Ronne BinningMcKenzie of the urine culture result.

## 2016-02-03 NOTE — Anesthesia Preprocedure Evaluation (Signed)
Anesthesia Evaluation  Patient identified by MRN, date of birth, ID band Patient awake    Reviewed: Allergy & Precautions, H&P , NPO status , Patient's Chart, lab work & pertinent test results  Airway Mallampati: II  TM Distance: >3 FB Neck ROM: full    Dental no notable dental hx. (+) Dental Advisory Given, Teeth Intact   Pulmonary neg pulmonary ROS, former smoker,    Pulmonary exam normal breath sounds clear to auscultation       Cardiovascular Exercise Tolerance: Good negative cardio ROS Normal cardiovascular exam Rhythm:regular Rate:Normal  Sinus bradycardia   Neuro/Psych Blind left eye negative neurological ROS  negative psych ROS   GI/Hepatic negative GI ROS, Neg liver ROS,   Endo/Other  negative endocrine ROSdiabetes, Well Controlled, Type 2, Insulin Dependent, Oral Hypoglycemic Agents  Renal/GU negative Renal ROS  negative genitourinary   Musculoskeletal   Abdominal   Peds  Hematology negative hematology ROS (+)   Anesthesia Other Findings   Reproductive/Obstetrics negative OB ROS                             Anesthesia Physical Anesthesia Plan  ASA: III  Anesthesia Plan: General   Post-op Pain Management:    Induction: Intravenous  Airway Management Planned: LMA  Additional Equipment:   Intra-op Plan:   Post-operative Plan:   Informed Consent: I have reviewed the patients History and Physical, chart, labs and discussed the procedure including the risks, benefits and alternatives for the proposed anesthesia with the patient or authorized representative who has indicated his/her understanding and acceptance.   Dental Advisory Given  Plan Discussed with: CRNA  Anesthesia Plan Comments:         Anesthesia Quick Evaluation

## 2016-02-04 ENCOUNTER — Encounter (HOSPITAL_COMMUNITY)
Admission: RE | Disposition: A | Discharge status: Home / Self Care | Payer: Self-pay | Source: Ambulatory Visit | Attending: Urology

## 2016-02-04 ENCOUNTER — Ambulatory Visit (HOSPITAL_COMMUNITY): Payer: Medicare Other | Admitting: Anesthesiology

## 2016-02-04 ENCOUNTER — Ambulatory Visit (HOSPITAL_COMMUNITY)
Admission: RE | Admit: 2016-02-04 | Discharge: 2016-02-04 | Disposition: A | Discharge status: Home / Self Care | Payer: Medicare Other | Source: Ambulatory Visit | Attending: Urology | Admitting: Urology

## 2016-02-04 ENCOUNTER — Encounter (HOSPITAL_COMMUNITY): Payer: Self-pay | Admitting: *Deleted

## 2016-02-04 DIAGNOSIS — N210 Calculus in bladder: Secondary | ICD-10-CM | POA: Insufficient documentation

## 2016-02-04 DIAGNOSIS — Z79899 Other long term (current) drug therapy: Secondary | ICD-10-CM | POA: Insufficient documentation

## 2016-02-04 DIAGNOSIS — Z794 Long term (current) use of insulin: Secondary | ICD-10-CM | POA: Diagnosis not present

## 2016-02-04 DIAGNOSIS — N401 Enlarged prostate with lower urinary tract symptoms: Secondary | ICD-10-CM | POA: Insufficient documentation

## 2016-02-04 DIAGNOSIS — Z7982 Long term (current) use of aspirin: Secondary | ICD-10-CM | POA: Insufficient documentation

## 2016-02-04 DIAGNOSIS — Z87891 Personal history of nicotine dependence: Secondary | ICD-10-CM | POA: Diagnosis not present

## 2016-02-04 DIAGNOSIS — H5442 Blindness, left eye, normal vision right eye: Secondary | ICD-10-CM | POA: Diagnosis not present

## 2016-02-04 DIAGNOSIS — E119 Type 2 diabetes mellitus without complications: Secondary | ICD-10-CM | POA: Insufficient documentation

## 2016-02-04 DIAGNOSIS — N138 Other obstructive and reflux uropathy: Secondary | ICD-10-CM | POA: Insufficient documentation

## 2016-02-04 DIAGNOSIS — M1990 Unspecified osteoarthritis, unspecified site: Secondary | ICD-10-CM | POA: Diagnosis not present

## 2016-02-04 DIAGNOSIS — Z96641 Presence of right artificial hip joint: Secondary | ICD-10-CM | POA: Insufficient documentation

## 2016-02-04 DIAGNOSIS — Z87442 Personal history of urinary calculi: Secondary | ICD-10-CM | POA: Insufficient documentation

## 2016-02-04 DIAGNOSIS — R300 Dysuria: Secondary | ICD-10-CM | POA: Diagnosis present

## 2016-02-04 HISTORY — PX: HOLMIUM LASER APPLICATION: SHX5852

## 2016-02-04 HISTORY — PX: CYSTOSCOPY WITH LITHOLAPAXY: SHX1425

## 2016-02-04 HISTORY — PX: CYSTOSCOPY WITH INSERTION OF UROLIFT: SHX6678

## 2016-02-04 LAB — GLUCOSE, CAPILLARY
GLUCOSE-CAPILLARY: 152 mg/dL — AB (ref 65–99)
Glucose-Capillary: 154 mg/dL — ABNORMAL HIGH (ref 65–99)

## 2016-02-04 SURGERY — CYSTOSCOPY, WITH BLADDER CALCULUS LITHOLAPAXY
Anesthesia: General

## 2016-02-04 MED ORDER — FENTANYL CITRATE (PF) 100 MCG/2ML IJ SOLN
INTRAMUSCULAR | Status: AC
  Filled 2016-02-04: qty 2

## 2016-02-04 MED ORDER — PROPOFOL 10 MG/ML IV BOLUS
INTRAVENOUS | Status: AC
  Filled 2016-02-04: qty 20

## 2016-02-04 MED ORDER — CEFAZOLIN SODIUM-DEXTROSE 2-4 GM/100ML-% IV SOLN
2.0000 g | INTRAVENOUS | Status: AC
  Administered 2016-02-04: 2 g via INTRAVENOUS

## 2016-02-04 MED ORDER — LACTATED RINGERS IV SOLN: INTRAVENOUS | Status: DC

## 2016-02-04 MED ORDER — LIDOCAINE HCL (CARDIAC) 20 MG/ML IV SOLN
INTRAVENOUS | Status: AC
  Filled 2016-02-04: qty 5

## 2016-02-04 MED ORDER — PROPOFOL 10 MG/ML IV BOLUS
INTRAVENOUS | Status: DC | PRN
  Administered 2016-02-04: 80 mg via INTRAVENOUS
  Administered 2016-02-04: 200 mg via INTRAVENOUS

## 2016-02-04 MED ORDER — FENTANYL CITRATE (PF) 100 MCG/2ML IJ SOLN
25.0000 ug | INTRAMUSCULAR | Status: DC | PRN
  Administered 2016-02-04 (×2): 50 ug via INTRAVENOUS

## 2016-02-04 MED ORDER — CEFAZOLIN SODIUM-DEXTROSE 2-4 GM/100ML-% IV SOLN
INTRAVENOUS | Status: AC
  Filled 2016-02-04: qty 100

## 2016-02-04 MED ORDER — MIDAZOLAM HCL 5 MG/5ML IJ SOLN
INTRAMUSCULAR | Status: DC | PRN
  Administered 2016-02-04: 2 mg via INTRAVENOUS

## 2016-02-04 MED ORDER — OXYCODONE-ACETAMINOPHEN 5-325 MG PO TABS: 1.0000 | ORAL_TABLET | ORAL | Status: DC | PRN

## 2016-02-04 MED ORDER — FENTANYL CITRATE (PF) 100 MCG/2ML IJ SOLN
25.0000 ug | INTRAMUSCULAR | Status: DC | PRN
Start: 2016-02-04 — End: 2016-02-04
  Administered 2016-02-04: 50 ug via INTRAVENOUS

## 2016-02-04 MED ORDER — INSULIN ASPART 100 UNIT/ML ~~LOC~~ SOLN: 0.0000 [IU] | SUBCUTANEOUS | Status: DC

## 2016-02-04 MED ORDER — ONDANSETRON HCL 4 MG/2ML IJ SOLN
INTRAMUSCULAR | Status: DC | PRN
  Administered 2016-02-04: 4 mg via INTRAVENOUS

## 2016-02-04 MED ORDER — LIDOCAINE HCL (CARDIAC) 20 MG/ML IV SOLN
INTRAVENOUS | Status: DC | PRN
  Administered 2016-02-04: 100 mg via INTRATRACHEAL

## 2016-02-04 MED ORDER — LACTATED RINGERS IV SOLN
INTRAVENOUS | Status: DC | PRN
  Administered 2016-02-04: 07:00:00 via INTRAVENOUS

## 2016-02-04 MED ORDER — MIDAZOLAM HCL 2 MG/2ML IJ SOLN
INTRAMUSCULAR | Status: AC
  Filled 2016-02-04: qty 2

## 2016-02-04 MED ORDER — SODIUM CHLORIDE 0.9 % IR SOLN
Status: DC | PRN
  Administered 2016-02-04: 6000 mL via INTRAVESICAL

## 2016-02-04 MED ORDER — ONDANSETRON HCL 4 MG/2ML IJ SOLN
INTRAMUSCULAR | Status: AC
  Filled 2016-02-04: qty 2

## 2016-02-04 SURGICAL SUPPLY — 16 items
BAG URINE DRAINAGE (UROLOGICAL SUPPLIES) ×3 IMPLANT
BAG URO CATCHER STRL LF (MISCELLANEOUS) ×3 IMPLANT
CATH FOLEY 2WAY SLVR  5CC 16FR (CATHETERS) ×2
CATH FOLEY 2WAY SLVR 5CC 16FR (CATHETERS) ×1 IMPLANT
FIBER LASER FLEXIVA 1000 (UROLOGICAL SUPPLIES) ×3 IMPLANT
FIBER LASER FLEXIVA 365 (UROLOGICAL SUPPLIES) IMPLANT
FIBER LASER FLEXIVA 550 (UROLOGICAL SUPPLIES) IMPLANT
FIBER LASER TRAC TIP (UROLOGICAL SUPPLIES) IMPLANT
GLOVE BIO SURGEON STRL SZ8 (GLOVE) ×3 IMPLANT
GOWN STRL REUS W/TWL XL LVL3 (GOWN DISPOSABLE) ×6 IMPLANT
MANIFOLD NEPTUNE II (INSTRUMENTS) ×3 IMPLANT
PACK CYSTO (CUSTOM PROCEDURE TRAY) ×3 IMPLANT
SYSTEM UROLIFT (Male Continence) ×12 IMPLANT
TRAY FOLEY BAG SILVER LF 16FR (CATHETERS) ×3 IMPLANT
TUBING CONNECTING 10 (TUBING) ×2 IMPLANT
TUBING CONNECTING 10' (TUBING) ×1

## 2016-02-04 NOTE — Op Note (Signed)
   PREOPERATIVE DIAGNOSIS: Benign prostatic hypertrophy with bladder outlet obstruction, bladder calculus  POSTOPERATIVE DIAGNOSIS: Benign prostatic hypertrophy with bladder outlet obstruction, bladder calculus  PROCEDURE: Cystoscopy with implantation of UroLift devices, 4 implants. Cystolithalopaxy  SURGEON: Wilkie AyePatrick Maryland Luppino, M.D.  ANESTHESIA: General  ANTIBIOTICS: Ancef  SPECIMEN: Bladder calculus  DRAINS: A 16-French Foley catheter.  BLOOD LOSS: Minimal.  COMPLICATIONS: None.  INDICATIONS:The Patient is an 60 year old white male with BPH and bladder outlet obstruction with a bladder caclulus. He has failed medical therapy and has elected UroLift for definitive treatment.  FINDINGS OF PROCEDURE: He was taken to the operating room where a genral anesthetic was induced. He was placed in lithotomy position and was fitted with PAS hose. His perineum and genitalia were prepped with chlorhexidine, and he was draped in usual sterile fashion.  Cystoscopy was performed using the UroLift scope and 0 degree lens. Examination revealed a normal urethra. The external sphincter was intact. Prostatic urethra was approximately 4 cm in length with lateral lobe enlargement. There was also little bit of bladder neck elevation. Inspection of bladder revealed mild-to-moderate trabeculation with a 2cm stones calculus. numerous cellules were noted. Ureteral orifices were in their normal anatomic position effluxing clear Urine. Using the 1000nm laser fiber the bladder calculus was fragmented and the fragments were then evacuated from the bladder.  The visual obturator was replaced with the first UroLift device. This was turned to the 9 o'clock position and pulled back to the veru and then slightly advanced. Pressure was then applied to the right lateral lobe and the UroLift device was deployed.  The second UroLift device was then inserted and applied to the left lateral  lobe at 3 o'clock and deployed in the mid prostatic urethra. After this, there was still some apparent obstruction closer to the bladder neck. So a second level of UroLift your left device was applied between the mid urethra and the proximal urethra providing further patency to the prostatic urethra. At this point, there was mild bleeding but the patient did have a spinal anesthetic. So it was thought that a Foley catheter was indicated. The scope was removed and a 16-French Foley catheter was inserted without difficulty. The balloon was filled with 10 mL sterile fluid, and the catheter was placed to straight drainage.  COMPLICATIONS: None   CONDITION: Stable, extubated, transferred to PACU  PLAN: The patient will be discharged home and followup in 1 days for a voiding trial.

## 2016-02-04 NOTE — Anesthesia Procedure Notes (Signed)
Procedure Name: LMA Insertion Date/Time: 02/04/2016 7:43 AM Performed by: [place], Kenzleigh Sedam C Pre-anesthesia Checklist: Patient identified, Emergency Drugs available, Suction available, Patient being monitored and Timeout performed Patient Re-evaluated:Patient Re-evaluated prior to inductionOxygen Delivery Method: Circle system utilized Preoxygenation: Pre-oxygenation with 100% oxygen Intubation Type: IV induction LMA: LMA inserted LMA Size: 4.0 Number of attempts: 1 Placement Confirmation: positive ETCO2,  CO2 detector and breath sounds checked- equal and bilateral Tube secured with: Tape

## 2016-02-04 NOTE — Transfer of Care (Signed)
Immediate Anesthesia Transfer of Care Note  Patient: Ortencia KickLeo Keplinger  Procedure(s) Performed: Procedure(s): CYSTOSCOPY WITH LITHOLAPAXY (N/A) CYSTOSCOPY WITH INSERTION OF UROLIFT (N/A) HOLMIUM LASER APPLICATION (N/A)  Patient Location: PACU  Anesthesia Type:General  Level of Consciousness: awake, alert  and oriented  Airway & Oxygen Therapy: Patient Spontanous Breathing and Patient connected to face mask oxygen  Post-op Assessment: Report given to RN and Post -op Vital signs reviewed and stable  Post vital signs: Reviewed and stable  Last Vitals:  Filed Vitals:   02/04/16 0516  BP: 136/76  Pulse: 50  Temp: 36.4 C  Resp: 18    Last Pain:  Filed Vitals:   02/04/16 0550  PainSc: 4       Patients Stated Pain Goal: 10 (02/04/16 0546)  Complications: No apparent anesthesia complications

## 2016-02-04 NOTE — Anesthesia Postprocedure Evaluation (Signed)
Anesthesia Post Note  Patient: Alex Beltran  Procedure(s) Performed: Procedure(s) (LRB): CYSTOSCOPY WITH LITHOLAPAXY (N/A) CYSTOSCOPY WITH INSERTION OF UROLIFT (N/A) HOLMIUM LASER APPLICATION (N/A)  Patient location during evaluation: PACU Anesthesia Type: General Level of consciousness: awake and alert Pain management: pain level controlled Vital Signs Assessment: post-procedure vital signs reviewed and stable Respiratory status: spontaneous breathing, nonlabored ventilation, respiratory function stable and patient connected to nasal cannula oxygen Cardiovascular status: blood pressure returned to baseline and stable Postop Assessment: no signs of nausea or vomiting Anesthetic complications: no    Last Vitals:  Filed Vitals:   02/04/16 0830 02/04/16 0845  BP: 122/76 135/86  Pulse: 65 50  Temp:    Resp: 11 10    Last Pain:  Filed Vitals:   02/04/16 0858  PainSc: 10-Worst pain ever                 Alex Beltran

## 2016-02-04 NOTE — Discharge Instructions (Signed)
Cystoscopy, Care After °Refer to this sheet in the next few weeks. These instructions provide you with information on caring for yourself after your procedure. Your caregiver may also give you more specific instructions. Your treatment has been planned according to current medical practices, but problems sometimes occur. Call your caregiver if you have any problems or questions after your procedure. °HOME CARE INSTRUCTIONS  °Things you can do to ease any discomfort after your procedure include: °· Drinking enough water and fluids to keep your urine clear or pale yellow. °· Taking a warm bath to relieve any burning feelings. °SEEK IMMEDIATE MEDICAL CARE IF:  °· You have an increase in blood in your urine. °· You notice blood clots in your urine. °· You have difficulty passing urine. °· You have the chills. °· You have abdominal pain. °· You have a fever or persistent symptoms for more than 2-3 days. °· You have a fever and your symptoms suddenly get worse. °MAKE SURE YOU:  °· Understand these instructions. °· Will watch your condition. °· Will get help right away if you are not doing well or get worse. °  °This information is not intended to replace advice given to you by your health care provider. Make sure you discuss any questions you have with your health care provider. °  °Document Released: 02/25/2005 Document Revised: 08/29/2014 Document Reviewed: 01/30/2012 °Elsevier Interactive Patient Education ©2016 Elsevier Inc[employer] °General Anesthesia, Adult, Care After °Refer to this sheet in the next few weeks. These instructions provide you with information on caring for yourself after your procedure. Your health care provider may also give you more specific instructions. Your treatment has been planned according to current medical practices, but problems sometimes occur. Call your health care provider if you have any problems or questions after your procedure. °WHAT TO EXPECT AFTER THE PROCEDURE °After the procedure, it  is typical to experience: °· Sleepiness. °· Nausea and vomiting. °HOME CARE INSTRUCTIONS °· For the first 24 hours after general anesthesia: °¨ Have a responsible person with you. °¨ Do not drive a car. If you are alone, do not take public transportation. °¨ Do not drink alcohol. °¨ Do not take medicine that has not been prescribed by your health care provider. °¨ Do not sign important papers or make important decisions. °¨ You may resume a normal diet and activities as directed by your health care provider. °· Change bandages (dressings) as directed. °· If you have questions or problems that seem related to general anesthesia, call the hospital and ask for the anesthetist or anesthesiologist on call. °SEEK MEDICAL CARE IF: °· You have nausea and vomiting that continue the day after anesthesia. °· You develop a rash. °SEEK IMMEDIATE MEDICAL CARE IF:  °· You have difficulty breathing. °· You have chest pain. °· You have any allergic problems. °  °This information is not intended to replace advice given to you by your health care provider. Make sure you discuss any questions you have with your health care provider. °  °Document Released: 11/14/2000 Document Revised: 08/29/2014 Document Reviewed: 12/07/2011 °Elsevier Interactive Patient Education ©2016 Elsevier Inc. ° °

## 2016-02-04 NOTE — Progress Notes (Signed)
Pt states [facility] currently and has had 4 doses of his antibiotic, starting Tuesday PM.

## 2016-02-04 NOTE — H&P (Signed)
Urology Admission H&P  Chief Complaint: dysuria  History of Present Illness: Mr Bir is a 60yo with a hx of BPH and bladder calculus. He had an office cystoscopy which showed a 2cm bladder calculus and bilobar hypertrophy  Past Medical History  Diagnosis Date  . Arthritis   . Diabetes mellitus (HCC)   . HLD (hyperlipidemia)   . Reflux     hx of  . History of kidney stones     yearly for last 5 years  . Blind left eye   . Frequent urination   . BPH (benign prostatic hyperplasia)   . Joint pain   . History of bladder infections since nov 2016  . Skin irritation     RIGHT CHEST SMALL RED AREA PT TO CALL DR Bettylou Frew AND MAKE AWARE OF  . Complication of anesthesia     slow to awaken with last surgery   Past Surgical History  Procedure Laterality Date  . Total hip arthroplasty Right 2009  . Bladder surgery  2013, 14, 15, 16    stones  . Nissan fundoplication  2000  . 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 Mass removed from right hip as child      Home Medications:  Prescriptions prior to admission  Medication Sig Dispense Refill Last Dose  . aspirin EC 81 MG tablet Take 81 mg by mouth daily.   02/03/2016 at 0500  . fenofibrate (TRICOR) 145 MG tablet Take 145 mg by mouth every evening.    02/03/2016 at 1700  . glipiZIDE (GLUCOTROL XL) 10 MG 24 hr tablet Take 10 mg by mouth daily.  0 02/03/2016 at 1700  . insulin glargine (LANTUS) 100 UNIT/ML injection Inject 40 Units into the skin at bedtime.    02/03/2016 at 2130  . lisinopril (PRINIVIL,ZESTRIL) 10 MG tablet Take 10 mg by mouth daily.   02/03/2016 at 0500  . metFORMIN (GLUCOPHAGE) 1000 MG tablet Take 1,000 mg by mouth 2 (two) times daily.    02/03/2016 at 1730  . nitrofurantoin, macrocrystal-monohydrate, (MACROBID) 100 MG capsule Take 1 capsule (100 mg total) by mouth every 12 (twelve) hours. (Patient taking differently: Take 100 mg by mouth every 12  (twelve) hours. Take for 7 days starting on 01/11/16.) 14 capsule 0   . omega-3 acid ethyl esters (LOVAZA) 1 g capsule Take 2 g by mouth 2 (two) times daily.   02/03/2016 at 1730  . rosuvastatin (CRESTOR) 20 MG tablet Take 20 mg by mouth every evening.    02/03/2016 at 1730  . tamsulosin (FLOMAX) 0.4 MG CAPS capsule Take 1 capsule (0.4 mg total) by mouth daily. 30 capsule 3 02/03/2016 at 1730  . celecoxib (CELEBREX) 100 MG capsule Take 100 mg by mouth daily.   More than a month at Unknown time  . finasteride (PROSCAR) 5 MG tablet Take 1 tablet (5 mg total) by mouth daily. (Patient not taking: Reported on 01/13/2016) 30 tablet 6 Not Taking  . tetracycline (ACHROMYCIN,SUMYCIN) 500 MG capsule Take 1 capsule (500 mg total) by mouth 4 (four) times daily. 14 capsule 0 02/04/2016 at 0330   Allergies:  Allergies  Allergen Reactions  . Morphine And Related Nausea And Vomiting  . Niacin And Related Other (See Comments)    tachycardia    Family History  Problem Relation Age of Onset  . Tuberculosis Mother   . Melanoma Mother  Social History:  reports that he quit smoking about 2 years ago. His smoking use included Cigarettes. He has a 60 pack-year smoking history. He has never used smokeless tobacco. He reports that he drinks alcohol. He reports that he uses illicit drugs (Marijuana).  Review of Systems  All other systems reviewed and are negative.   Physical Exam:  Vital signs in last 24 hours: Temp:  [97.6 F (36.4 C)] 97.6 F (36.4 C) (06/15 0516) Pulse Rate:  [50] 50 (06/15 0516) Resp:  [18] 18 (06/15 0516) BP: (136)/(76) 136/76 mmHg (06/15 0516) SpO2:  [100 %] 100 % (06/15 0516) Weight:  [92.987 kg (205 lb)] 92.987 kg (205 lb) (06/15 0546) Physical Exam  Constitutional: He is oriented to person, place, and time. He appears well-developed and well-nourished.  HENT:  Head: Normocephalic and atraumatic.  Eyes: EOM are normal.  Neck: Normal range of motion. No thyromegaly present.   Cardiovascular: Normal rate and regular rhythm.   Respiratory: Effort normal. No respiratory distress.  GI: Soft. He exhibits no distension.  Musculoskeletal: Normal range of motion.  Neurological: He is alert and oriented to person, place, and time.  Skin: Skin is warm and dry.  Psychiatric: He has a normal mood and affect. His behavior is normal. Judgment and thought content normal.    Laboratory Data:  Results for orders placed or performed during the hospital encounter of 02/04/16 (from the past 24 hour(s))  Glucose, capillary     Status: Abnormal   Collection Time: 02/04/16  5:12 AM  Result Value Ref Range   Glucose-Capillary 154 (H) 65 - 99 mg/dL   Recent Results (from the past 240 hour(s))  CULTURE, URINE COMPREHENSIVE     Status: Abnormal   Collection Time: 01/27/16  8:48 AM  Result Value Ref Range Status   Urine Culture, Comprehensive Final report (A)  Final   Result 1 Comment (A)  Final    Comment: Staphylococcus epidermidis Greater than 100,000 colony forming units per mL Based on resistance to penicillin and susceptibility to oxacillin this isolate would be susceptible to: * Penicillinase-stable penicillins; such as:     Cloxacillin     Dicloxacillin     Nafcillin * Beta-lactam/beta-lactamase inhibitor combinations; such as:     Amoxicillin-clavulanic acid     Ampicillin-sulbactam * Antistaphylococcal cephems; such as:     Cefaclor     Cefuroxime * Antistaphylococcal carbapenems; such as:     Imipenem     Meropenem    ANTIMICROBIAL SUSCEPTIBILITY Comment  Final    Comment:       ** S = Susceptible; I = Intermediate; R = Resistant **                    P = Positive; N = Negative             MICS are expressed in micrograms per mL    Antibiotic                 RSLT#1    RSLT#2    RSLT#3    RSLT#4 Ciprofloxacin                  R Gentamicin                     S Levofloxacin                   I Linezolid  S Nitrofurantoin                  S Oxacillin                      S Penicillin                     R Quinupristin/Dalfopristin      S Rifampin                       S Tetracycline                   S Trimethoprim/Sulfa             R Vancomycin                     S   Microscopic Examination     Status: Abnormal   Collection Time: 01/27/16  8:48 AM  Result Value Ref Range Status   WBC, UA >30 (A) 0 -  5 /hpf Final   RBC, UA 3-10 (A) 0 -  2 /hpf Final   Epithelial Cells (non renal) None seen 0 - 10 /hpf Final   Bacteria, UA Few None seen/Few Final   Creatinine: No results for input(s): CREATININE in the last 168 hours. Baseline Creatinine: unknwon  Impression/Assessment:  59yo with BPH and bladder calculus  Plan:  The risks/benefits/alternatives to cystolithalopaxy and urolift was explained to the patient and he understands and wishes to proceed with surgery  Wilkie Ayeatrick Mitchelle Goerner 02/04/2016, 7:31 AM

## 2016-02-05 ENCOUNTER — Ambulatory Visit (INDEPENDENT_AMBULATORY_CARE_PROVIDER_SITE_OTHER): Payer: Medicare Other

## 2016-02-05 DIAGNOSIS — N401 Enlarged prostate with lower urinary tract symptoms: Secondary | ICD-10-CM

## 2016-02-05 DIAGNOSIS — N138 Other obstructive and reflux uropathy: Secondary | ICD-10-CM

## 2016-02-05 NOTE — Progress Notes (Signed)
Fill and Pull Catheter Removal  Patient is present today for a catheter removal.  Patient was cleaned and prepped in a sterile fashion 70ml of sterile water/ saline was instilled into the bladder when the patient felt the urge to urinate. 10ml of water was then drained from the balloon.  A 16FR foley cath was removed from the bladder no complications were noted .  Patient as then given some time to void on their own.  Patient can void  100ml on their own after some time.  Patient tolerated well.  Preformed by: Rupert Stackshelsea Watkins, LPN   Follow up/ Additional notes: Pt was not able to have very much sterile water instilled due to the feeling of needing to have a BM. Reinforced with pt to drink push fluids and if not able to void by 3pm today to come back to our office. Reinforced if he develops n/v, f/c, severe lower abd over the weekend to go straight to the ER. Pt voiced understanding of whole conversation.

## 2016-02-12 ENCOUNTER — Telehealth: Payer: Self-pay

## 2016-02-12 NOTE — Telephone Encounter (Signed)
Per Dr. Ronne BinningMcKenzie pt does not need tamsulosin anymore. Pt has been made aware.

## 2016-03-17 ENCOUNTER — Ambulatory Visit (INDEPENDENT_AMBULATORY_CARE_PROVIDER_SITE_OTHER): Payer: Medicare Other

## 2016-03-17 VITALS — BP 104/58 | HR 61 | Temp 97.6°F | Ht 67.0 in | Wt 207.6 lb

## 2016-03-17 DIAGNOSIS — N390 Urinary tract infection, site not specified: Secondary | ICD-10-CM | POA: Diagnosis not present

## 2016-03-17 LAB — URINALYSIS, COMPLETE
Bilirubin, UA: NEGATIVE
KETONES UA: NEGATIVE
Leukocytes, UA: NEGATIVE
NITRITE UA: NEGATIVE
Protein, UA: NEGATIVE
RBC UA: NEGATIVE
Specific Gravity, UA: >1.03 — ABNORMAL HIGH (ref 1.005–1.030)
UUROB: 0.2 mg/dL (ref 0.2–1.0)
pH, UA: 5 (ref 5.0–7.5)

## 2016-03-17 LAB — MICROSCOPIC EXAMINATION: WBC UA: NONE SEEN /HPF (ref 0–?)

## 2016-03-17 NOTE — Progress Notes (Signed)
Pt came in today with c/o possible infection post surgery. Pt c/o urine being amber in color and having a strong odor. Pt denied n/v, f/c, dysuria. Clean catch was obtained for u/a and cx.

## 2016-03-19 LAB — CULTURE, URINE COMPREHENSIVE

## 2016-04-03 NOTE — Progress Notes (Signed)
04/04/2016 8:54 AM   Alex Beltran [birth year] [id number]  Referring provider: Oswaldo ConroyAbby Daneele Bender, MD [address] [place], [place] [id number]  Chief Complaint  Patient presents with  . Follow-up    BPH    HPI: Patient is a 60 year old Caucasian male with a history of bladder stones and BPH with LUTS who presents today for a follow-up visit.  History of bladder stones Patient underwent a cystolitholapaxy with UroLift on 02/04/2016 with Dr. Ronne BinningMcKenzie for 2 cm bladder stone.  He is urinating without difficulty.  He is not having infections or hematuria.    BPH WITH LUTS His IPSS score today is 1, which is mild lower urinary tract symptomatology. He is delighted with his quality life due to his urinary symptoms. His previous IPSS score was 29/6.  He denies any dysuria, hematuria or suprapubic pain.   He also denies any recent fevers, chills, nausea or vomiting.  He does not have a family history of PCa.      IPSS    Row Name 04/04/16 0800         International Prostate Symptom Score   How often have you had the sensation of not emptying your bladder? Not at All     How often have you had to urinate less than every two hours? Not at All     How often have you found you stopped and started again several times when you urinated? Not at All     How often have you found it difficult to postpone urination? Not at All     How often have you had a weak urinary stream? Not at All     How often have you had to strain to start urination? Not at All     How many times did you typically get up at night to urinate? 1 Time     Total IPSS Score 1       Quality of Life due to urinary symptoms   If you were to spend the rest of your life with your urinary condition just the way it is now how would you feel about that? Delighted        Score:  1-7 Mild 8-19 Moderate 20-35 Severe    PMH: Past Medical History:  Diagnosis Date  . Arthritis   . Blind left eye   . BPH  (benign prostatic hyperplasia)   . Complication of anesthesia    slow to awaken with last surgery  . Diabetes mellitus (HCC)   . Frequent urination   . History of bladder infections since nov 2016  . History of kidney stones    yearly for last 5 years  . HLD (hyperlipidemia)   . Joint pain   . Reflux    hx of  . Skin irritation    RIGHT CHEST SMALL RED AREA PT TO CALL DR MCKENZIE AND MAKE AWARE OF    Surgical History: Past Surgical History:  Procedure Laterality Date  . BLADDER SURGERY  2013, 14, 15, 16   stones  . 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 CYSTOSCOPY WITH INSERTION OF UROLIFT N/A 02/04/2016   Procedure: CYSTOSCOPY WITH INSERTION OF UROLIFT;  Surgeon: Malen GauzePatrick L McKenzie, MD;  Location: [facility];  Service: Urology;  Laterality: N/A;  . CYSTOSCOPY WITH LITHOLAPAXY N/A 02/04/2016   Procedure: CYSTOSCOPY WITH LITHOLAPAXY;  Surgeon: Luisa HartPatrick  Sandria Manly, MD;  Location: [facility];  Service: Urology;  Laterality: N/A;  . HOLMIUM LASER APPLICATION N/A 02/04/2016   Procedure: HOLMIUM LASER APPLICATION;  Surgeon: Malen Gauze, MD;  Location: [facility];  Service: Urology;  Laterality: N/A;  . mass removed from right hip as child    . nissan fundoplication  2000  . TOTAL HIP ARTHROPLASTY Right 2009    Home Medications:    Medication List       Accurate as of 04/04/16  8:54 AM. Always use your most recent med list.          aspirin EC 81 MG tablet Take 81 mg by mouth daily.   celecoxib 100 MG capsule Commonly known as:  CELEBREX Take 100 mg by mouth daily.   fenofibrate 145 MG tablet Commonly known as:  TRICOR Take 145 mg by mouth every evening.   finasteride 5 MG tablet Commonly known as:  PROSCAR Take 1 tablet (5 mg total) by mouth daily.   glipiZIDE 10 MG 24 hr tablet Commonly known as:  GLUCOTROL XL Take 10 mg by mouth daily.   insulin glargine 100 UNIT/ML injection Commonly known  as:  LANTUS Inject 40 Units into the skin at bedtime.   lisinopril 10 MG tablet Commonly known as:  PRINIVIL,ZESTRIL Take 10 mg by mouth daily.   metFORMIN 1000 MG tablet Commonly known as:  GLUCOPHAGE Take 1,000 mg by mouth 2 (two) times daily.   omega-3 acid ethyl esters 1 g capsule Commonly known as:  LOVAZA Take 2 g by mouth 2 (two) times daily.   oxyCODONE-acetaminophen 5-325 MG tablet Commonly known as:  ROXICET Take 1 tablet by mouth every 4 (four) hours as needed for severe pain.   rosuvastatin 20 MG tablet Commonly known as:  CRESTOR Take 20 mg by mouth every evening.   tamsulosin 0.4 MG Caps capsule Commonly known as:  FLOMAX Take 1 capsule (0.4 mg total) by mouth daily.   tetracycline 500 MG capsule Commonly known as:  ACHROMYCIN,SUMYCIN Take 1 capsule (500 mg total) by mouth 4 (four) times daily.       Allergies:  Allergies  Allergen Reactions  . Morphine And Related Nausea And Vomiting  . Niacin And Related Other (See Comments)    tachycardia    Family History: Family History  Problem Relation Age of Onset  . Tuberculosis Mother   . Melanoma Mother     Social History:  reports that he quit smoking about 2 years ago. His smoking use included Cigarettes. He has a 60.00 pack-year smoking history. He has never used smokeless tobacco. He reports that he drinks alcohol. He reports that he uses drugs, including Marijuana.  ROS: UROLOGY Frequent Urination?: No Hard to postpone urination?: No Burning/pain with urination?: No Get up at night to urinate?: No Leakage of urine?: No Urine stream starts and stops?: No Trouble starting stream?: No Do you have to strain to urinate?: No Blood in urine?: No Urinary tract infection?: No Sexually transmitted disease?: No Injury to kidneys or bladder?: No Painful intercourse?: No Weak stream?: No Erection problems?: No Penile pain?: No  Gastrointestinal Nausea?: No Vomiting?: No Indigestion/heartburn?:  No Diarrhea?: No Constipation?: No  Constitutional Fever: No Night sweats?: No Weight loss?: No Fatigue?: No  Skin Skin rash/lesions?: No Itching?: No  Eyes Blurred vision?: No Double vision?: No  Ears/Nose/Throat Sore throat?: No Sinus problems?: No  Hematologic/Lymphatic Swollen glands?: No Easy bruising?: No  Cardiovascular Leg swelling?: No Chest pain?: No  Respiratory Cough?: No Shortness of breath?: No  Endocrine Excessive thirst?: No  Musculoskeletal Back pain?: No Joint pain?: No  Neurological Headaches?: No Dizziness?: No  Psychologic Depression?: No Anxiety?: No  Physical Exam: BP 111/72 (BP Location: Left Arm, Patient Position: Sitting, Cuff Size: Large)   Pulse (!) 47   Ht 5\' 7"  (1.702 m)   Wt 208 lb 9.6 oz (94.6 kg)   BMI 32.67 kg/m   Constitutional: Well nourished. Alert and oriented, No acute distress. HEENT: [place] AT, moist mucus membranes. Trachea midline, no masses. Cardiovascular: No clubbing, cyanosis, or edema. Respiratory: Normal respiratory effort, no increased work of breathing. GI: Abdomen is soft, non tender, non distended, no abdominal masses. Liver and spleen not palpable.  No hernias appreciated.  Stool sample for occult testing is not indicated.   GU: No CVA tenderness.  No bladder fullness or masses.  Patient with uncircumcised phallus. Foreskin easily retracted  Urethral meatus is patent.  No penile discharge. No penile lesions or rashes. Scrotum without lesions, cysts, rashes and/or edema.  Testicles are located scrotally bilaterally. No masses are appreciated in the testicles. Left and right epididymis are normal. Rectal: Patient with  normal sphincter tone. Anus and perineum without scarring or rashes. No rectal masses are appreciated. Prostate is approximately 55 grams, no nodules are appreciated. Seminal vesicles are normal. Skin: No rashes, bruises or suspicious lesions. Lymph: No cervical or inguinal  adenopathy. Neurologic: Grossly intact, no focal deficits, moving all 4 extremities. Psychiatric: Normal mood and affect.  Laboratory Data: Lab Results  Component Value Date   WBC 7.2 01/26/2016   HGB 14.6 01/26/2016   HCT 42.4 01/26/2016   MCV 83.0 01/26/2016   PLT 170 01/26/2016    Lab Results  Component Value Date   CREATININE 0.87 09/01/2014    Lab Results  Component Value Date   HGBA1C 8.9 (H) 01/26/2016    Assessment & Plan:    1. History of bladder stone  - patient will contact us if he develops difficulty with urination, hematuria and/or infections  2. BPH with LUTS  - IPSS score is 1/0, it is improving  - Continue conservative management, avoiding bladder irritants and timed voiding's  - PSA  - RTC in 12 months for IPSS, PSA and exam, if PSA is normal   Return in about 1 year (around 04/04/2017) for IPSS, PSA and exam.  These notes generated with voice recognition software. I apologize for typographical errors.  Michiel Cowboy, PA-C  [facility] [address] [place], [place] [address] [phone]

## 2016-04-04 ENCOUNTER — Encounter: Payer: Self-pay | Admitting: Urology

## 2016-04-04 ENCOUNTER — Ambulatory Visit (INDEPENDENT_AMBULATORY_CARE_PROVIDER_SITE_OTHER): Payer: Medicare Other | Admitting: Urology

## 2016-04-04 VITALS — BP 111/72 | HR 47 | Ht 67.0 in | Wt 208.6 lb

## 2016-04-04 DIAGNOSIS — N401 Enlarged prostate with lower urinary tract symptoms: Secondary | ICD-10-CM | POA: Diagnosis not present

## 2016-04-04 DIAGNOSIS — N138 Other obstructive and reflux uropathy: Secondary | ICD-10-CM

## 2016-04-04 DIAGNOSIS — Z87448 Personal history of other diseases of urinary system: Secondary | ICD-10-CM

## 2016-04-05 ENCOUNTER — Telehealth: Payer: Self-pay

## 2016-04-05 LAB — PSA: PROSTATE SPECIFIC AG, SERUM: 0.6 ng/mL (ref 0.0–4.0)

## 2016-04-05 NOTE — Telephone Encounter (Signed)
Spoke with pt in reference to PSA results and yearly screenings. Pt voiced understanding.

## 2016-04-05 NOTE — Telephone Encounter (Signed)
-----   Message from Harle BattiestShannon A McGowan, PA-C sent at 04/05/2016  8:13 AM EDT ----- Please notify the patient that his PSA is stable.  AUA guidelines suggest yearly screenings.

## 2017-03-26 DIAGNOSIS — G894 Chronic pain syndrome: Secondary | ICD-10-CM

## 2017-03-26 DIAGNOSIS — F1190 Opioid use, unspecified, uncomplicated: Secondary | ICD-10-CM | POA: Insufficient documentation

## 2017-03-26 HISTORY — DX: Chronic pain syndrome: G89.4

## 2017-03-26 NOTE — Progress Notes (Addendum)
Patient's Name: Alex Beltran  MRN: [id number]  Referring Provider: Letta Median, MD  DOB: 11/03/1955  PCP: Letta Median, MD  DOS: 03/27/2017  Note by: Dionisio David NP  Service setting: Ambulatory outpatient  Specialty: Interventional Pain Management  Location: [facility] (AMB) Pain Management Facility    Patient type: New Patient    Primary Reason(s) for Visit: Initial Patient Evaluation CC: Hip Pain (right)  HPI  Mr. Kutner is a 61 y.o. year old, male patient, who comes today for an initial evaluation. He has Recurrent UTI; Microscopic hematuria; Calculus of bladder; BPH (benign prostatic hyperplasia); Long term current use of opiate analgesic; Long term prescription opiate use; Opiate use; Chronic pain syndrome; Hip pain (Primary) Right; Lower extremity pain (secondary) Right; and Sacroiliac joint pain (terirary) right on his problem list.. His primarily concern today is the Hip Pain (right)  Pain Assessment: Location: Right Hip Radiating: stays in the hip Onset: More than a month ago Duration: Chronic pain Quality: Aching, Constant, Sharp Severity: 8 /10 (self-reported pain score)  Note: Reported level is compatible with observation.                   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 Severity: NAS-11 at its worse: 10/10, NAS-11 at its best: 6/10, NAS-11 now: 9/10 and NAS-11 on the average: 8/10 Timing: Not influenced by the time of the day, During activity or exercise, After activity or exercise and After a period of immobility Aggravating Factors: Bending, Climbing, Kneeling, Lifiting, Prolonged sitting, Prolonged standing, Walking, Walking uphill, Walking downhill and Working Alleviating Factors: Medications Associated Problems: Erectile dysfunction, Numbness and Pain that wakes patient up Quality of Pain: Constant and Stabbing Previous  Examinations or Tests: Bone scan, CT scan, MRI scan, X-rays, Nerve conduction test and Orthoperdic evaluation Previous Treatments: Narcotic medications, TENS and Trigger point injections  The patient comes into the clinics today for the first time for a chronic pain management evaluation. According to patient his primary area of pain is in his right hip. 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 He does admit that it did help with his leg discrepancy. He admits that he has had hip injection however this was ineffective. He had physical therapy after surgery which was effective at that time. He denies any recent images.  His second area of pain is his left knee. He admits that he had surgery as a child; age 17 to slow down growth secondary to leg discrepancy.   Today I took the time to provide the patient with information regarding this pain practice. The patient was informed that the practice is divided into two sections: an interventional pain management section, as well as a completely separate and distinct medication management section. I explained that there are procedure days for interventional therapies, and evaluation days for follow-ups and medication management. Because of the amount of documentation required during both, they are kept separated. This means that there is the possibility that he may be scheduled for a procedure on one day, and medication management the next. I have also informed his that because of staffing and facility limitations, this practice will no longer take patients for medication management only. To illustrate the reasons for this, I gave the patient the example of surgeons,  and how inappropriate it would be to refer a patient to his/her care, just to write for the post-surgical antibiotics on a surgery done by a different surgeon.   Because interventional pain management is  part of the board-certified specialty for the doctors, the patient was informed that joining this practice means that they are open to any and all interventional therapies. I made it clear that this does not mean that they will be forced to have any procedures done. What this means is that I believe interventional therapies to be essential part of the diagnosis and proper management of chronic pain conditions. Therefore, patients not interested in these interventional alternatives will be better served under the care of a different practitioner.  The patient was also made aware of my Comprehensive Pain Management Safety Guidelines where by joining this practice, they limit all of their nerve blocks and joint injections to those done by our practice, for as long as we are retained to manage their care. Historic Controlled Substance Pharmacotherapy Review  PMP and historical list of controlled substances: Virtussin AC, oxycodone/acetaminophen 5/325, oxycodone 15 mg, Highest opioid analgesic regimen found: Oxycodone 15 mg 4 times daily (fill date July 21, 2011) Oxycodone 60 mg per day Most recent opioid analgesic: None Current opioid analgesics: None Highest recorded MME/day: 90 mg/day MME/day: 0 mg/day Medications: The patient did not bring the medication(s) to the appointment, as requested in our "New Patient Package" Pharmacodynamics: Desired effects: Analgesia: The patient reports >50% benefit. Reported improvement in function: The patient reports medication allows him to accomplish basic ADLs. Clinically meaningful improvement in function (CMIF): Sustained CMIF goals met Perceived effectiveness: Described as relatively effective, allowing for increase in activities of daily living (ADL) Undesirable effects: Side-effects or Adverse reactions: None reported Historical Monitoring: The patient  reports that he uses drugs, including Marijuana. List of all UDS Test(s): No results found for:  MDMA, COCAINSCRNUR, PCPSCRNUR, PCPQUANT, CANNABQUANT, THCU, [place] List of all Serum Drug Screening Test(s):  No results found for: AMPHSCRSER, BARBSCRSER, BENZOSCRSER, COCAINSCRSER, PCPSCRSER, PCPQUANT, THCSCRSER, CANNABQUANT, OPIATESCRSER, OXYSCRSER, PROPOXSCRSER Historical Background Evaluation: [place] PDMP: Six (6) year initial data search conducted.             [place] Department of public safety, offender search: Editor, commissioning Information) Non-contributory Risk Assessment Profile: Aberrant behavior: None observed or detected today Risk factors for fatal opioid overdose: caucasian and male gender Fatal overdose hazard ratio (HR): 1.92 for 50-99 MME/day Non-fatal overdose hazard ratio (HR): 1.44 for 20-49 MME/day Risk of opioid abuse or dependence: 0.7-3.0% with doses ? 36 MME/day and 6.1-26% with doses ? 120 MME/day. Substance use disorder (SUD) risk level: Pending results of Medical Psychology Evaluation for SUD Opioid risk tool (ORT) (Total Score): 0  ORT Scoring interpretation table:  Score <3 = Low Risk for SUD  Score between 4-7 = Moderate Risk for SUD  Score >8 = High Risk for Opioid Abuse   PHQ-2 Depression Scale:  Total score: 0  PHQ-2 Scoring interpretation table: (Score and probability of major depressive disorder)  Score 0 = No depression  Score 1 = 15.4% Probability  Score 2 = 21.1% Probability  Score 3 = 38.4% Probability  Score 4 = 45.5% Probability  Score 5 = 56.4% Probability  Score 6 = 78.6% Probability   PHQ-9 Depression Scale:  Total score: 0  PHQ-9 Scoring interpretation table:  Score 0-4 = No depression  Score 5-9 = Mild depression  Score 10-14 = Moderate depression  Score 15-19 = Moderately severe depression  Score 20-27 = Severe depression (2.4 times higher risk of SUD and 2.89 times higher risk of overuse)   Pharmacologic Plan: Pending ordered tests and/or consults  Meds  The patient has a current medication list which includes the following prescription(s):  aspirin ec, celecoxib, fenofibrate, glipizide, ibuprofen, insulin glargine, lantus solostar, lisinopril, metformin, omega-3 acid ethyl esters, and rosuvastatin.  Current Outpatient Prescriptions on File Prior to Visit  Medication Sig  . aspirin EC 81 MG tablet Take 81 mg by mouth daily.  . celecoxib (CELEBREX) 100 MG capsule Take 100 mg by mouth daily.  . fenofibrate (TRICOR) 145 MG tablet Take 145 mg by mouth every evening.   Marland Kitchen glipiZIDE (GLUCOTROL XL) 10 MG 24 hr tablet Take 10 mg by mouth daily.  . insulin glargine (LANTUS) 100 UNIT/ML injection Inject 50 Units into the skin at bedtime.   Marland Kitchen lisinopril (PRINIVIL,ZESTRIL) 10 MG tablet Take 10 mg by mouth daily.  . metFORMIN (GLUCOPHAGE) 1000 MG tablet Take 1,000 mg by mouth 2 (two) times daily.   Marland Kitchen omega-3 acid ethyl esters (LOVAZA) 1 g capsule Take 2 g by mouth 2 (two) times daily.  . rosuvastatin (CRESTOR) 20 MG tablet Take 20 mg by mouth every evening.    No current facility-administered medications on file prior to visit.    Imaging Review   Note: Available results from prior imaging studies were reviewed.        ROS  Cardiovascular History: Daily Aspirin intake Pulmonary or Respiratory History: No reported pulmonary signs or symptoms such as wheezing and difficulty taking a deep full breath (Asthma), difficulty blowing air out (Emphysema), coughing up mucus (Bronchitis), persistent dry cough, or temporary stoppage of breathing during sleep Neurological History: No reported neurological signs or symptoms such as seizures, abnormal skin sensations, urinary and/or fecal incontinence, being born with an abnormal open spine and/or a tethered spinal cord Review of Past Neurological Studies: No results found for this or any previous visit. Psychological-Psychiatric History: No reported psychological or psychiatric signs or symptoms such as difficulty sleeping, anxiety, depression, delusions or hallucinations (schizophrenial), mood swings  (bipolar disorders) or suicidal ideations or attempts Gastrointestinal History: No reported gastrointestinal signs or symptoms such as vomiting or evacuating blood, reflux, heartburn, alternating episodes of diarrhea and constipation, inflamed or scarred liver, or pancreas or irrregular and/or infrequent bowel movements Genitourinary History: Passing kidney stones and Recurrent Urinary Tract infections Hematological History: No reported hematological signs or symptoms such as prolonged bleeding, low or poor functioning platelets, bruising or bleeding easily, hereditary bleeding problems, low energy levels due to low hemoglobin or being anemic Endocrine History: High blood sugar requiring insulin (IDDM) Rheumatologic History: No reported rheumatological signs and symptoms such as fatigue, joint pain, tenderness, swelling, redness, heat, stiffness, decreased range of motion, with or without associated rash Musculoskeletal History: Negative for myasthenia gravis, muscular dystrophy, multiple sclerosis or malignant hyperthermia Work History: Disabled  Allergies  Mr. Heal is allergic to morphine and related and niacin and related.  Laboratory Chemistry  Inflammation Markers No results found for: CRP, ESRSEDRATE (CRP: Acute Phase) (ESR: Chronic Phase) Renal Function Markers Lab Results  Component Value Date   BUN 14 09/01/2014   CREATININE 0.87 09/01/2014   GFRAA >60 09/01/2014   GFRNONAA >60 09/01/2014   Hepatic Function Markers No results found for: AST, ALT, ALBUMIN, ALKPHOS, HCVAB Electrolytes Lab Results  Component Value Date   NA 139 09/01/2014   K 3.8 09/01/2014   CL 106 09/01/2014   CALCIUM 8.4 (L) 09/01/2014  Neuropathy Markers No results found for: [id number] Bone Pathology Markers Lab Results  Component Value Date   CALCIUM 8.4 (L) 09/01/2014   Coagulation Parameters Lab Results  Component Value Date   PLT 170 01/26/2016   Cardiovascular Markers Lab Results   Component Value Date   HGB 14.6 01/26/2016   HCT 42.4 01/26/2016   Note: Lab results reviewed.  [place]  Drug: Mr. Hanauer  reports that he uses drugs, including Marijuana. Alcohol:  reports that he drinks alcohol. Tobacco:  reports that he quit smoking about 3 years ago. His smoking use included Cigarettes. He has a 60.00 pack-year smoking history. He has never used smokeless tobacco. Medical:  has a past medical history of Arthritis; Blind left eye; BPH (benign prostatic hyperplasia); Complication of anesthesia; Diabetes mellitus ([place]); Frequent urination; History of bladder infections (since nov 2016); History of kidney stones; HLD (hyperlipidemia); Joint pain; Reflux; and Skin irritation. Family: family history includes Melanoma in his mother; Tuberculosis in his mother.  Past Surgical History:  Procedure Laterality Date  . BLADDER SURGERY  2013, 14, 15, 16   stones  . 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 CYSTOSCOPY WITH INSERTION OF UROLIFT N/A 02/04/2016   Procedure: CYSTOSCOPY WITH INSERTION OF UROLIFT;  Surgeon: Cleon Gustin, MD;  Location: [facility];  Service: Urology;  Laterality: N/A;  . CYSTOSCOPY WITH LITHOLAPAXY N/A 02/04/2016   Procedure: CYSTOSCOPY WITH LITHOLAPAXY;  Surgeon: Cleon Gustin, MD;  Location: [facility];  Service: Urology;  Laterality: N/A;  . HOLMIUM LASER APPLICATION N/A 02/18/5283   Procedure: HOLMIUM LASER APPLICATION;  Surgeon: Cleon Gustin, MD;  Location: [facility];  Service: Urology;  Laterality: N/A;  . mass removed from right hip as child    . nissan fundoplication  1324  . TOTAL HIP ARTHROPLASTY Right 2009   Active Ambulatory Problems    Diagnosis Date Noted  . Recurrent UTI 12/01/2015  . Microscopic hematuria 12/01/2015  . Calculus of bladder 12/01/2015  . BPH (benign prostatic hyperplasia) 12/01/2015  . Long term current use of opiate analgesic 03/26/2017  .  Long term prescription opiate use 03/26/2017  . Opiate use 03/26/2017  . Chronic pain syndrome 03/26/2017  . Hip pain (Primary) Right 03/27/2017  . Lower extremity pain (secondary) Right 03/27/2017  . Sacroiliac joint pain (terirary) right 03/27/2017   Resolved Ambulatory Problems    Diagnosis Date Noted  . No Resolved Ambulatory Problems   Past Medical History:  Diagnosis Date  . Arthritis   . Blind left eye   . BPH (benign prostatic hyperplasia)   . Complication of anesthesia   . Diabetes mellitus ([place])   . Frequent urination   . History of bladder infections since nov 2016  . History of kidney stones   . HLD (hyperlipidemia)   . Joint pain   . Reflux   . Skin irritation    Constitutional Exam  General appearance: Well nourished, well developed, and well hydrated. In no apparent acute distress Vitals:   03/27/17 0759  BP: 139/88  Pulse: (!) 16  Resp: 16  Temp: 98.2 F (36.8 C)  SpO2: 97%  Weight: 206 lb (93.4 kg)  Height: 5' 7" (1.702 m)   BMI Assessment: Estimated body mass index is 32.26 kg/m as calculated from the following:   Height as of this encounter: 5' 7" (1.702 m).   Weight as of this encounter:  206 lb (93.4 kg).  BMI interpretation table: BMI level Category Range association with higher incidence of chronic pain  <18 kg/m2 Underweight   18.5-24.9 kg/m2 Ideal body weight   25-29.9 kg/m2 Overweight Increased incidence by 20%  30-34.9 kg/m2 Obese (Class I) Increased incidence by 68%  35-39.9 kg/m2 Severe obesity (Class II) Increased incidence by 136%  >40 kg/m2 Extreme obesity (Class III) Increased incidence by 254%   BMI Readings from Last 4 Encounters:  03/27/17 32.26 kg/m  04/04/16 32.67 kg/m  03/17/16 32.51 kg/m  02/04/16 32.11 kg/m   Wt Readings from Last 4 Encounters:  03/27/17 206 lb (93.4 kg)  04/04/16 208 lb 9.6 oz (94.6 kg)  03/17/16 207 lb 9.6 oz (94.2 kg)  02/04/16 205 lb (93 kg)  Psych/Mental status: Alert, oriented x 3  (person, place, & time)       Eyes: PERLA Respiratory: No evidence of acute respiratory distress  Cervical Spine Exam  Inspection: No masses, redness, or swelling Alignment: Symmetrical Functional ROM: Unrestricted ROM      Stability: No instability detected Muscle strength & Tone: Functionally intact Sensory: Unimpaired Palpation: No palpable anomalies              Upper Extremity (UE) Exam    Side: Right upper extremity  Side: Left upper extremity  Inspection: No masses, redness, swelling, or asymmetry. No contractures  Inspection: No masses, redness, swelling, or asymmetry. No contractures  Functional ROM: Unrestricted ROM          Functional ROM: Unrestricted ROM          Muscle strength & Tone: Functionally intact  Muscle strength & Tone: Functionally intact  Sensory: Unimpaired  Sensory: Unimpaired  Palpation: No palpable anomalies              Palpation: No palpable anomalies              Specialized Test(s): Deferred         Specialized Test(s): Deferred          Thoracic Spine Exam  Inspection: No masses, redness, or swelling Alignment: Symmetrical Functional ROM: Unrestricted ROM Stability: No instability detected Sensory: Unimpaired Muscle strength & Tone: No palpable anomalies  Lumbar Spine Exam  Inspection: No masses, redness, or swelling Alignment: Symmetrical Functional ROM: Unrestricted ROM      Stability: No instability detected Muscle strength & Tone: Functionally intact Sensory: Unimpaired Palpation: No palpable anomalies       Provocative Tests: Lumbar Hyperextension and rotation test: Negative       Patrick's Maneuver: Unable to perform on the right secondary to hip surgery                    Gait & Posture Assessment  Ambulation: Unassisted Gait: Relatively normal for age and body habitus Posture: WNL   Lower Extremity Exam    Side: Right lower extremity  Side: Left lower extremity  Inspection: No masses, redness, swelling, or asymmetry. No  contractures  Inspection: No masses, redness, swelling, or asymmetry. No contractures  Functional ROM: Pain restricted ROM          Functional ROM: Unrestricted ROM          Muscle strength & Tone: Able to Toe-walk without problems  Muscle strength & Tone: Able to Toe-walk without problems  Sensory: Unimpaired  Sensory: Unimpaired  Palpation: Complains of area being tender to palpation  Palpation: No palpable anomalies   Assessment  Primary Diagnosis & Pertinent Problem List: The  primary encounter diagnosis was Pain of right hip joint. Diagnoses of Pain of right lower extremity, Sacroiliac joint pain (terirary) right, Chronic pain syndrome, and Long term current use of opiate analgesic were also pertinent to this visit.  Visit Diagnosis: 1. Pain of right hip joint   2. Pain of right lower extremity   3. Sacroiliac joint pain (terirary) right   4. Chronic pain syndrome   5. Long term current use of opiate analgesic    Plan of Care  Initial treatment plan:  Please be advised that as per protocol, today's visit has been an evaluation only. We have not taken over the patient's controlled substance management.  Problem-specific plan: No problem-specific Assessment & Plan notes found for this encounter.  Ordered Lab-work, Procedure(s), Referral(s), & Consult(s): Orders Placed This Encounter  Procedures  . DG HIP UNILAT W OR W/O PELVIS 2-3 VIEWS RIGHT  . DG Si Joints  . DG Lumbar Spine Complete W/Bend  . Compliance Drug Analysis, Ur  . Comprehensive metabolic panel  . C-reactive protein  . Sedimentation rate  . Magnesium  . 25-Hydroxyvitamin D Lcms D2+D3  . Vitamin B12  . Ambulatory referral to Psychology   Pharmacotherapy: Medications ordered:  No orders of the defined types were placed in this encounter.  Medications administered during this visit: Mr. Tugwell had no medications administered during this visit.   Pharmacotherapy under consideration:  Opioid Analgesics: The  patient was informed that there is no guarantee that he would be a candidate for opioid analgesics. The decision will be made following CDC guidelines. This decision will be based on the results of diagnostic studies, as well as Mr. Kleiner's risk profile. Seen at previous pain clinic in [place] and [place] Membrane stabilizer: To be determined at a later time Muscle relaxant: To be determined at a later time NSAID: To be determined at a later time Other analgesic(s): To be determined at a later time   Interventional therapies under consideration: Mr. Yera was informed that there is no guarantee that he would be a candidate for interventional therapies. The decision will be based on the results of diagnostic studies, as well as Mr. Leflore's risk profile.  Possible procedure(s): Possible diagnostic right-sided sacroiliac joint injection Possible Diagnostic right-sided femoral nerve block Possible  right-sided hip radiofrequency ablation Possible diagnostic left intra-articular knee injection Possible diagnostic left genicular nerve block Possible left-sided knee radiofrequency ablation    Provider-requested follow-up: Return for 2nd Visit, w/ Dr. Dossie Arbour, after [facility] eval.  Future Appointments Date Time Provider [place]  03/28/2017 8:45 AM [facility] None  04/04/2017 8:45 AM [place], Marlowe Kays None    Primary Care Physician: Letta Median, MD Location: [facility] Out[facility] Management Facility Note by:  Date: 03/27/2017; Time: 2:09 PM  Pain Score Disclaimer: We use the NRS-11 scale. This is a self-reported, subjective measurement of pain severity with only modest accuracy. It is used primarily to identify changes within a particular patient. It must be understood that outpatient pain scales are significantly less accurate that those used for research, where they can be applied under ideal controlled circumstances with  minimal exposure to variables. In reality, the score is likely to be a combination of pain intensity and pain affect, where pain affect describes the degree of emotional arousal or changes in action readiness caused by the sensory experience of pain. Factors such as social and work situation, setting, emotional state, anxiety levels, expectation, and prior pain experience may influence  pain perception and show large inter-individual differences that may also be affected by time variables.  Patient instructions provided during this appointment: Patient Instructions   Pain Management Discharge Instructions  General Discharge Instructions :  If you need to reach your doctor call: Monday-Friday 8:00 am - 4:00 pm at [phone] or toll free [phone].  After clinic hours [phone] to have operator reach doctor.  Bring all of your medication bottles to all your appointments in the pain clinic.  To cancel or reschedule your appointment with Pain Management please remember to call 24 hours in advance to avoid a fee.  Refer to the educational materials which you have been given on: General Risks, I had my Procedure. Discharge Instructions, Post Sedation.  Post Procedure Instructions:  The drugs you were given will stay in your system until tomorrow, so for the next 24 hours you should not drive, make any legal decisions or drink any alcoholic beverages.  You may eat anything you prefer, but it is better to start with liquids then soups and crackers, and gradually work up to solid foods.  Please notify your doctor immediately if you have any unusual bleeding, trouble breathing or pain that is not related to your normal pain.  Depending on the type of procedure that was done, some parts of your body may feel week and/or numb.  This usually clears up by tonight or the next day.  Walk with the use of an assistive device or accompanied by an adult for the 24 hours.  You may use ice on the  affected area for the first 24 hours.  Put ice in a Ziploc bag and cover with a towel and place against area 15 minutes on 15 minutes off.  You may switch to heat after 24 hours. ____________________________________________________________________________________________  Appointment Policy Summary  It is our goal and responsibility to provide the medical community with assistance in the evaluation and management of patients with chronic pain. Unfortunately our resources are limited. Because we do not have an unlimited amount of time, or available appointments, we are required to closely monitor and manage their use. The following rules exist to maximize their use:  Patient's responsibilities: 1. Punctuality:  At what time should I arrive? You should be physically present in our office 30 minutes before your scheduled appointment. Your scheduled appointment is with your assigned healthcare provider. However, it takes 5-10 minutes to be "checked-in", and another 15 minutes for the nurses to do the admission. If you arrive to our office at the time you were given for your appointment, you will end up being at least 20-25 minutes late to your appointment with the provider. 2. Tardiness:  What happens if I arrive only a few minutes after my scheduled appointment time? You will need to reschedule your appointment. The cutoff is your appointment time. This is why it is so important that you arrive at least 30 minutes before that appointment. If you have an appointment scheduled for 10:00 AM and you arrive at 10:01, you will be required to reschedule your appointment.  3. Plan ahead:  Always assume that you will encounter traffic on your way in. Plan for it. If you are dependent on a driver, make sure they understand these rules and the need to arrive early. 4. Other appointments and responsibilities:  Avoid scheduling any other appointments before or after your pain clinic appointments.  5. Be prepared:   Write down everything that you need to discuss with your healthcare provider and  give this information to the admitting nurse. Write down the medications that you will need refilled. Bring your pills and bottles (even the empty ones), to all of your appointments, except for those where a procedure is scheduled. 6. No children or pets:  Find someone to take care of them. It is not appropriate to bring them in. 7. Scheduling changes:  We request "advanced notification" of any changes or cancellations. 8. Advanced notification:  Defined as a time period of more than 24 hours prior to the originally scheduled appointment. This allows for the appointment to be offered to other patients. 9. Rescheduling:  When a visit is rescheduled, it will require the cancellation of the original appointment. For this reason they both fall within the category of "Cancellations".  10. Cancellations:  They require advanced notification. Any cancellation less than 24 hours before the  appointment will be recorded as a "No Show". 11. No Show:  Defined as an unkept appointment where the patient failed to notify or declare to the practice their intention or inability to keep the appointment.  Corrective process for repeat offenders:  1. Tardiness: Three (3) episodes of rescheduling due to late arrivals will be recorded as one (1) "No Show". 2. Cancellation or reschedule: Three (3) cancellations or rescheduling will be recorded as one (1) "No Show". 3. "No Shows": Three (3) "No Shows" within a 12 month period will result in discharge from the practice.  ____________________________________________________________________________________________  ____________________________________________________________________________________________  Pain Scale  Introduction: The pain score used by this practice is the Verbal Numerical Rating Scale (VNRS-11). This is an 11-point scale. It is for adults and children 10 years or  older. There are significant differences in how the pain score is reported, used, and applied. Forget everything you learned in the past and learn this scoring system.  General Information: The scale should reflect your current level of pain. Unless you are specifically asked for the level of your worst pain, or your average pain. If you are asked for one of these two, then it should be understood that it is over the past 24 hours.  Basic Activities of Daily Living (ADL): Personal hygiene, dressing, eating, transferring, and using restroom.  Instructions: Most patients tend to report their level of pain as a combination of two factors, their physical pain and their psychosocial pain. This last one is also known as "suffering" and it is reflection of how physical pain affects you socially and psychologically. From now on, report them separately. From this point on, when asked to report your pain level, report only your physical pain. Use the following table for reference.  Pain Clinic Pain Levels (0-5/10)  Pain Level Score  Description  No Pain 0   Mild pain 1 Nagging, annoying, but does not interfere with basic activities of daily living (ADL). Patients are able to eat, bathe, get dressed, toileting (being able to get on and off the toilet and perform personal hygiene functions), transfer (move in and out of bed or a chair without assistance), and maintain continence (able to control bladder and bowel functions). Blood pressure and heart rate are unaffected. A normal heart rate for a healthy adult ranges from 60 to 100 bpm (beats per minute).   Mild to moderate pain 2 Noticeable and distracting. Impossible to hide from other people. More frequent flare-ups. Still possible to adapt and function close to normal. It can be very annoying and may have occasional stronger flare-ups. With discipline, patients may get used to it and  adapt.   Moderate pain 3 Interferes significantly with activities of daily  living (ADL). It becomes difficult to feed, bathe, get dressed, get on and off the toilet or to perform personal hygiene functions. Difficult to get in and out of bed or a chair without assistance. Very distracting. With effort, it can be ignored when deeply involved in activities.   Moderately severe pain 4 Impossible to ignore for more than a few minutes. With effort, patients may still be able to manage work or participate in some social activities. Very difficult to concentrate. Signs of autonomic nervous system discharge are evident: dilated pupils (mydriasis); mild sweating (diaphoresis); sleep interference. Heart rate becomes elevated (>115 bpm). Diastolic blood pressure (lower number) rises above 100 mmHg. Patients find relief in laying down and not moving.   Severe pain 5 Intense and extremely unpleasant. Associated with frowning face and frequent crying. Pain overwhelms the senses.  Ability to do any activity or maintain social relationships becomes significantly limited. Conversation becomes difficult. Pacing back and forth is common, as getting into a comfortable position is nearly impossible. Pain wakes you up from deep sleep. Physical signs will be obvious: pupillary dilation; increased sweating; goosebumps; brisk reflexes; cold, clammy hands and feet; nausea, vomiting or dry heaves; loss of appetite; significant sleep disturbance with inability to fall asleep or to remain asleep. When persistent, significant weight loss is observed due to the complete loss of appetite and sleep deprivation.  Blood pressure and heart rate becomes significantly elevated. Caution: If elevated blood pressure triggers a pounding headache associated with blurred vision, then the patient should immediately seek attention at an urgent or emergency care unit, as these may be signs of an impending stroke.    Emergency Department Pain Levels (6-10/10)  Emergency Room Pain 6 Severely limiting. Requires emergency care  and should not be seen or managed at an outpatient pain management facility. Communication becomes difficult and requires great effort. Assistance to reach the emergency department may be required. Facial flushing and profuse sweating along with potentially dangerous increases in heart rate and blood pressure will be evident.   Distressing pain 7 Self-care is very difficult. Assistance is required to transport, or use restroom. Assistance to reach the emergency department will be required. Tasks requiring coordination, such as bathing and getting dressed become very difficult.   Disabling pain 8 Self-care is no longer possible. At this level, pain is disabling. The individual is unable to do even the most "basic" activities such as walking, eating, bathing, dressing, transferring to a bed, or toileting. Fine motor skills are lost. It is difficult to think clearly.   Incapacitating pain 9 Pain becomes incapacitating. Thought processing is no longer possible. Difficult to remember your own name. Control of movement and coordination are lost.   The worst pain imaginable 10 At this level, most patients pass out from pain. When this level is reached, collapse of the autonomic nervous system occurs, leading to a sudden drop in blood pressure and heart rate. This in turn results in a temporary and dramatic drop in blood flow to the brain, leading to a loss of consciousness. Fainting is one of the body's self defense mechanisms. Passing out puts the brain in a calmed state and causes it to shut down for a while, in order to begin the healing process.    Summary: 1. Refer to this scale when providing [place] with your pain level. 2. Be accurate and careful when reporting your pain level. This will help with  your care. 3. Over-reporting your pain level will lead to loss of credibility. 4. Even a level of 1/10 means that there is pain and will be treated at our facility. 5. High, inaccurate reporting will be  documented as "Symptom Exaggeration", leading to loss of credibility and suspicions of possible secondary gains such as obtaining more narcotics, or wanting to appear disabled, for fraudulent reasons. 6. Only pain levels of 5 or below will be seen at our facility. 7. Pain levels of 6 and above will be sent to the Emergency Department and the appointment cancelled. ____________________________________________________________________________________________

## 2017-03-27 ENCOUNTER — Ambulatory Visit
Admission: RE | Admit: 2017-03-27 | Discharge: 2017-03-27 | Disposition: A | Discharge status: Home / Self Care | Payer: Medicare HMO | Source: Ambulatory Visit | Attending: Nurse Practitioner | Admitting: Nurse Practitioner

## 2017-03-27 ENCOUNTER — Ambulatory Visit (HOSPITAL_BASED_OUTPATIENT_CLINIC_OR_DEPARTMENT_OTHER): Payer: Medicare HMO | Admitting: Nurse Practitioner

## 2017-03-27 ENCOUNTER — Encounter: Payer: Self-pay | Admitting: Nurse Practitioner

## 2017-03-27 VITALS — BP 139/88 | HR 16 | Temp 98.2°F | Resp 16 | Ht 67.0 in | Wt 206.0 lb

## 2017-03-27 DIAGNOSIS — M533 Sacrococcygeal disorders, not elsewhere classified: Secondary | ICD-10-CM | POA: Insufficient documentation

## 2017-03-27 DIAGNOSIS — Z79891 Long term (current) use of opiate analgesic: Secondary | ICD-10-CM

## 2017-03-27 DIAGNOSIS — M25551 Pain in right hip: Secondary | ICD-10-CM | POA: Diagnosis present

## 2017-03-27 DIAGNOSIS — Z9889 Other specified postprocedural states: Secondary | ICD-10-CM | POA: Diagnosis not present

## 2017-03-27 DIAGNOSIS — G894 Chronic pain syndrome: Secondary | ICD-10-CM | POA: Insufficient documentation

## 2017-03-27 DIAGNOSIS — Z794 Long term (current) use of insulin: Secondary | ICD-10-CM | POA: Insufficient documentation

## 2017-03-27 DIAGNOSIS — Z96641 Presence of right artificial hip joint: Secondary | ICD-10-CM | POA: Diagnosis not present

## 2017-03-27 DIAGNOSIS — G8929 Other chronic pain: Secondary | ICD-10-CM | POA: Insufficient documentation

## 2017-03-27 DIAGNOSIS — R3129 Other microscopic hematuria: Secondary | ICD-10-CM | POA: Diagnosis not present

## 2017-03-27 DIAGNOSIS — M79604 Pain in right leg: Secondary | ICD-10-CM | POA: Insufficient documentation

## 2017-03-27 DIAGNOSIS — Z79899 Other long term (current) drug therapy: Secondary | ICD-10-CM | POA: Insufficient documentation

## 2017-03-27 DIAGNOSIS — M1990 Unspecified osteoarthritis, unspecified site: Secondary | ICD-10-CM | POA: Diagnosis not present

## 2017-03-27 DIAGNOSIS — E119 Type 2 diabetes mellitus without complications: Secondary | ICD-10-CM | POA: Diagnosis not present

## 2017-03-27 DIAGNOSIS — Z87442 Personal history of urinary calculi: Secondary | ICD-10-CM | POA: Insufficient documentation

## 2017-03-27 DIAGNOSIS — N400 Enlarged prostate without lower urinary tract symptoms: Secondary | ICD-10-CM | POA: Diagnosis not present

## 2017-03-27 DIAGNOSIS — E785 Hyperlipidemia, unspecified: Secondary | ICD-10-CM | POA: Diagnosis not present

## 2017-03-27 DIAGNOSIS — Z7982 Long term (current) use of aspirin: Secondary | ICD-10-CM | POA: Insufficient documentation

## 2017-03-27 DIAGNOSIS — Z885 Allergy status to narcotic agent status: Secondary | ICD-10-CM | POA: Diagnosis not present

## 2017-03-27 DIAGNOSIS — K219 Gastro-esophageal reflux disease without esophagitis: Secondary | ICD-10-CM | POA: Insufficient documentation

## 2017-03-27 DIAGNOSIS — Z87891 Personal history of nicotine dependence: Secondary | ICD-10-CM | POA: Diagnosis not present

## 2017-03-27 NOTE — Progress Notes (Signed)
Safety precautions to be maintained throughout the outpatient stay will include: orient to surroundings, keep bed in low position, maintain call bell within reach at all times, provide assistance with transfer out of bed and ambulation.  

## 2017-03-27 NOTE — Patient Instructions (Addendum)
Pain Management Discharge Instructions  General Discharge Instructions :  If you need to reach your doctor call: Monday-Friday 8:00 am - 4:00 pm at [phone] or toll free [phone].  After clinic hours [phone] to have operator reach doctor.  Bring all of your medication bottles to all your appointments in the pain clinic.  To cancel or reschedule your appointment with Pain Management please remember to call 24 hours in advance to avoid a fee.  Refer to the educational materials which you have been given on: General Risks, I had my Procedure. Discharge Instructions, Post Sedation.  Post Procedure Instructions:  The drugs you were given will stay in your system until tomorrow, so for the next 24 hours you should not drive, make any legal decisions or drink any alcoholic beverages.  You may eat anything you prefer, but it is better to start with liquids then soups and crackers, and gradually work up to solid foods.  Please notify your doctor immediately if you have any unusual bleeding, trouble breathing or pain that is not related to your normal pain.  Depending on the type of procedure that was done, some parts of your body may feel week and/or numb.  This usually clears up by tonight or the next day.  Walk with the use of an assistive device or accompanied by an adult for the 24 hours.  You may use ice on the affected area for the first 24 hours.  Put ice in a Ziploc bag and cover with a towel and place against area 15 minutes on 15 minutes off.  You may switch to heat after 24 hours. ____________________________________________________________________________________________  Appointment Policy Summary  It is our goal and responsibility to provide the medical community with assistance in the evaluation and management of patients with chronic pain. Unfortunately our resources are limited. Because we do not have an unlimited amount of time, or available appointments, we are  required to closely monitor and manage their use. The following rules exist to maximize their use:  Patient's responsibilities: 1. Punctuality:  At what time should I arrive? You should be physically present in our office 30 minutes before your scheduled appointment. Your scheduled appointment is with your assigned healthcare provider. However, it takes 5-10 minutes to be "checked-in", and another 15 minutes for the nurses to do the admission. If you arrive to our office at the time you were given for your appointment, you will end up being at least 20-25 minutes late to your appointment with the provider. 2. Tardiness:  What happens if I arrive only a few minutes after my scheduled appointment time? You will need to reschedule your appointment. The cutoff is your appointment time. This is why it is so important that you arrive at least 30 minutes before that appointment. If you have an appointment scheduled for 10:00 AM and you arrive at 10:01, you will be required to reschedule your appointment.  3. Plan ahead:  Always assume that you will encounter traffic on your way in. Plan for it. If you are dependent on a driver, make sure they understand these rules and the need to arrive early. 4. Other appointments and responsibilities:  Avoid scheduling any other appointments before or after your pain clinic appointments.  5. Be prepared:  Write down everything that you need to discuss with your healthcare provider and give this information to the admitting nurse. Write down the medications that you will need refilled. Bring your pills and bottles (even the empty ones), to all  of your appointments, except for those where a procedure is scheduled. 6. No children or pets:  Find someone to take care of them. It is not appropriate to bring them in. 7. Scheduling changes:  We request "advanced notification" of any changes or cancellations. 8. Advanced notification:  Defined as a time period of more than 24  hours prior to the originally scheduled appointment. This allows for the appointment to be offered to other patients. 9. Rescheduling:  When a visit is rescheduled, it will require the cancellation of the original appointment. For this reason they both fall within the category of "Cancellations".  10. Cancellations:  They require advanced notification. Any cancellation less than 24 hours before the  appointment will be recorded as a "No Show". 11. No Show:  Defined as an unkept appointment where the patient failed to notify or declare to the practice their intention or inability to keep the appointment.  Corrective process for repeat offenders:  1. Tardiness: Three (3) episodes of rescheduling due to late arrivals will be recorded as one (1) "No Show". 2. Cancellation or reschedule: Three (3) cancellations or rescheduling will be recorded as one (1) "No Show". 3. "No Shows": Three (3) "No Shows" within a 12 month period will result in discharge from the practice.  ____________________________________________________________________________________________  ____________________________________________________________________________________________  Pain Scale  Introduction: The pain score used by this practice is the Verbal Numerical Rating Scale (VNRS-11). This is an 11-point scale. It is for adults and children 10 years or older. There are significant differences in how the pain score is reported, used, and applied. Forget everything you learned in the past and learn this scoring system.  General Information: The scale should reflect your current level of pain. Unless you are specifically asked for the level of your worst pain, or your average pain. If you are asked for one of these two, then it should be understood that it is over the past 24 hours.  Basic Activities of Daily Living (ADL): Personal hygiene, dressing, eating, transferring, and using restroom.  Instructions: Most patients  tend to report their level of pain as a combination of two factors, their physical pain and their psychosocial pain. This last one is also known as "suffering" and it is reflection of how physical pain affects you socially and psychologically. From now on, report them separately. From this point on, when asked to report your pain level, report only your physical pain. Use the following table for reference.  Pain Clinic Pain Levels (0-5/10)  Pain Level Score  Description  No Pain 0   Mild pain 1 Nagging, annoying, but does not interfere with basic activities of daily living (ADL). Patients are able to eat, bathe, get dressed, toileting (being able to get on and off the toilet and perform personal hygiene functions), transfer (move in and out of bed or a chair without assistance), and maintain continence (able to control bladder and bowel functions). Blood pressure and heart rate are unaffected. A normal heart rate for a healthy adult ranges from 60 to 100 bpm (beats per minute).   Mild to moderate pain 2 Noticeable and distracting. Impossible to hide from other people. More frequent flare-ups. Still possible to adapt and function close to normal. It can be very annoying and may have occasional stronger flare-ups. With discipline, patients may get used to it and adapt.   Moderate pain 3 Interferes significantly with activities of daily living (ADL). It becomes difficult to feed, bathe, get dressed, get on and off  the toilet or to perform personal hygiene functions. Difficult to get in and out of bed or a chair without assistance. Very distracting. With effort, it can be ignored when deeply involved in activities.   Moderately severe pain 4 Impossible to ignore for more than a few minutes. With effort, patients may still be able to manage work or participate in some social activities. Very difficult to concentrate. Signs of autonomic nervous system discharge are evident: dilated pupils (mydriasis); mild  sweating (diaphoresis); sleep interference. Heart rate becomes elevated (>115 bpm). Diastolic blood pressure (lower number) rises above 100 mmHg. Patients find relief in laying down and not moving.   Severe pain 5 Intense and extremely unpleasant. Associated with frowning face and frequent crying. Pain overwhelms the senses.  Ability to do any activity or maintain social relationships becomes significantly limited. Conversation becomes difficult. Pacing back and forth is common, as getting into a comfortable position is nearly impossible. Pain wakes you up from deep sleep. Physical signs will be obvious: pupillary dilation; increased sweating; goosebumps; brisk reflexes; cold, clammy hands and feet; nausea, vomiting or dry heaves; loss of appetite; significant sleep disturbance with inability to fall asleep or to remain asleep. When persistent, significant weight loss is observed due to the complete loss of appetite and sleep deprivation.  Blood pressure and heart rate becomes significantly elevated. Caution: If elevated blood pressure triggers a pounding headache associated with blurred vision, then the patient should immediately seek attention at an urgent or emergency care unit, as these may be signs of an impending stroke.    Emergency Department Pain Levels (6-10/10)  Emergency Room Pain 6 Severely limiting. Requires emergency care and should not be seen or managed at an outpatient pain management facility. Communication becomes difficult and requires great effort. Assistance to reach the emergency department may be required. Facial flushing and profuse sweating along with potentially dangerous increases in heart rate and blood pressure will be evident.   Distressing pain 7 Self-care is very difficult. Assistance is required to transport, or use restroom. Assistance to reach the emergency department will be required. Tasks requiring coordination, such as bathing and getting dressed become very  difficult.   Disabling pain 8 Self-care is no longer possible. At this level, pain is disabling. The individual is unable to do even the most "basic" activities such as walking, eating, bathing, dressing, transferring to a bed, or toileting. Fine motor skills are lost. It is difficult to think clearly.   Incapacitating pain 9 Pain becomes incapacitating. Thought processing is no longer possible. Difficult to remember your own name. Control of movement and coordination are lost.   The worst pain imaginable 10 At this level, most patients pass out from pain. When this level is reached, collapse of the autonomic nervous system occurs, leading to a sudden drop in blood pressure and heart rate. This in turn results in a temporary and dramatic drop in blood flow to the brain, leading to a loss of consciousness. Fainting is one of the body's self defense mechanisms. Passing out puts the brain in a calmed state and causes it to shut down for a while, in order to begin the healing process.    Summary: 1. Refer to this scale when providing [place] with your pain level. 2. Be accurate and careful when reporting your pain level. This will help with your care. 3. Over-reporting your pain level will lead to loss of credibility. 4. Even a level of 1/10 means that there is pain and will  be treated at our facility. 5. High, inaccurate reporting will be documented as "Symptom Exaggeration", leading to loss of credibility and suspicions of possible secondary gains such as obtaining more narcotics, or wanting to appear disabled, for fraudulent reasons. 6. Only pain levels of 5 or below will be seen at our facility. 7. Pain levels of 6 and above will be sent to the Emergency Department and the appointment cancelled. ____________________________________________________________________________________________

## 2017-03-28 ENCOUNTER — Other Ambulatory Visit: Payer: Self-pay

## 2017-03-28 ENCOUNTER — Other Ambulatory Visit: Payer: Medicare HMO

## 2017-03-28 DIAGNOSIS — N401 Enlarged prostate with lower urinary tract symptoms: Secondary | ICD-10-CM

## 2017-03-29 LAB — PSA: Prostate Specific Ag, Serum: 0.8 ng/mL (ref 0.0–4.0)

## 2017-03-30 LAB — COMPLIANCE DRUG ANALYSIS, UR

## 2017-04-02 LAB — COMPREHENSIVE METABOLIC PANEL
A/G RATIO: 2 (ref 1.2–2.2)
ALBUMIN: 4.5 g/dL (ref 3.6–4.8)
ALT: 18 IU/L (ref 0–44)
AST: 17 IU/L (ref 0–40)
Alkaline Phosphatase: 44 IU/L (ref 39–117)
BUN / CREAT RATIO: 16 (ref 10–24)
BUN: 13 mg/dL (ref 8–27)
Bilirubin Total: 0.4 mg/dL (ref 0.0–1.2)
CALCIUM: 9.7 mg/dL (ref 8.6–10.2)
CHLORIDE: 107 mmol/L — AB (ref 96–106)
CO2: 21 mmol/L (ref 20–29)
Creatinine, Ser: 0.8 mg/dL (ref 0.76–1.27)
GFR, EST AFRICAN AMERICAN: 111 mL/min/{1.73_m2} (ref >59)
GFR, EST NON AFRICAN AMERICAN: 96 mL/min/{1.73_m2} (ref >59)
Globulin, Total: 2.2 g/dL (ref 1.5–4.5)
Glucose: 257 mg/dL — ABNORMAL HIGH (ref 65–99)
Potassium: 4.8 mmol/L (ref 3.5–5.2)
Sodium: 143 mmol/L (ref 134–144)
TOTAL PROTEIN: 6.7 g/dL (ref 6.0–8.5)

## 2017-04-02 LAB — 25-HYDROXYVITAMIN D LCMS D2+D3
25-HYDROXY, VITAMIN D-3: 18 ng/mL
25-HYDROXY, VITAMIN D: 18 ng/mL — AB

## 2017-04-02 LAB — MAGNESIUM: MAGNESIUM: 1.8 mg/dL (ref 1.6–2.3)

## 2017-04-02 LAB — C-REACTIVE PROTEIN: CRP: 1 mg/L (ref 0.0–4.9)

## 2017-04-02 LAB — SEDIMENTATION RATE: Sed Rate: 2 mm/hr (ref 0–30)

## 2017-04-02 LAB — 25-HYDROXY VITAMIN D LCMS D2+D3: 25-Hydroxy, Vitamin D-2: <1 ng/mL

## 2017-04-02 LAB — VITAMIN B12

## 2017-04-04 ENCOUNTER — Ambulatory Visit: Payer: Medicare Other | Admitting: Urology

## 2017-04-04 NOTE — Progress Notes (Signed)
Results were reviewed and found to be: mildly abnormal  No acute injury or pathology identified  Review would suggest interventional pain management techniques may be of benefit 

## 2017-04-04 NOTE — Progress Notes (Signed)
Results were reviewed and found to be: abnormal  No acute injury or pathology identified  Review would suggest interventional pain management techniques may be of benefit

## 2017-06-06 DIAGNOSIS — M899 Disorder of bone, unspecified: Secondary | ICD-10-CM | POA: Insufficient documentation

## 2017-06-06 DIAGNOSIS — E559 Vitamin D deficiency, unspecified: Secondary | ICD-10-CM | POA: Insufficient documentation

## 2017-06-06 DIAGNOSIS — G8929 Other chronic pain: Secondary | ICD-10-CM | POA: Insufficient documentation

## 2017-06-06 DIAGNOSIS — Z791 Long term (current) use of non-steroidal anti-inflammatories (NSAID): Secondary | ICD-10-CM | POA: Insufficient documentation

## 2017-06-06 DIAGNOSIS — M5441 Lumbago with sciatica, right side: Secondary | ICD-10-CM

## 2017-06-06 DIAGNOSIS — Z789 Other specified health status: Secondary | ICD-10-CM | POA: Insufficient documentation

## 2017-06-06 DIAGNOSIS — E538 Deficiency of other specified B group vitamins: Secondary | ICD-10-CM | POA: Insufficient documentation

## 2017-06-06 DIAGNOSIS — M5136 Other intervertebral disc degeneration, lumbar region: Secondary | ICD-10-CM | POA: Insufficient documentation

## 2017-06-06 DIAGNOSIS — Z79899 Other long term (current) drug therapy: Secondary | ICD-10-CM | POA: Insufficient documentation

## 2017-06-06 DIAGNOSIS — M4310 Spondylolisthesis, site unspecified: Secondary | ICD-10-CM | POA: Insufficient documentation

## 2017-06-06 NOTE — Progress Notes (Signed)
Patient's Name: Julis Haubner  MRN: [id number]  Referring Provider: Letta Median, MD  DOB: 04-21-1956  PCP: Letta Median, MD  DOS: 06/07/2017  Note by: Gaspar Cola, MD  Service setting: Ambulatory outpatient  Specialty: Interventional Pain Management  Location: [facility] (AMB) Pain Management Facility    Patient type: Established   Primary Reason(s) for Visit: Encounter for evaluation before starting new chronic pain management plan of care (Level of risk: moderate) CC: Hip Pain (right)  HPI  Mr. Rosenfield is a 61 y.o. year old, male patient, who comes today for a follow-up evaluation to review the test results and decide on a treatment plan. He has Recurrent UTI; Microscopic hematuria; Calculus of bladder; BPH (benign prostatic hyperplasia); Opiate use; Chronic pain syndrome; Chronic hip pain S/P THR (Right); Chronic lower extremity pain (Primary Area of Pain) (Right); Chronic sacroiliac joint pain (Right); Chronic low back pain (Secondary Area of Pain) (Right); DDD (degenerative disc disease), lumbar; Grade 1 Retrolisthesis of L4 over L5; NSAID long-term use; Disorder of skeletal system; Pharmacologic therapy; Problems influencing health status; Vitamin D insufficiency; Vitamin B12 deficiency; Neurogenic pain; Idiopathic peripheral neuropathy; and Osteoarthritis of hip (Right) (s/p THR) on his problem list. His primarily concern today is the Hip Pain (right)  Pain Assessment: Location: Right Hip Radiating: stays in the right hip Onset: More than a month ago Duration: Chronic pain Quality: Aching, Constant, Sharp (sharp knife like pain when walking) Severity: 8 /10 (self-reported pain score)  Note: Reported level is compatible with observation.                   When using our objective Pain Scale, levels between 6 and 10/10 are said to belong in an emergency room, as it progressively worsens from a 6/10, described as severely limiting, requiring emergency care not usually  available at an outpatient pain management facility. At a 6/10 level, communication becomes difficult and requires great effort. Assistance to reach the emergency department may be required. Facial flushing and profuse sweating along with potentially dangerous increases in heart rate and blood pressure will be evident. Effect on ADL: pace self Timing: Constant Modifying factors: medicine helped  Mr. Isip comes in today for a follow-up visit after his initial evaluation on 03/27/2017. Today we went over the results of his tests. These were explained in "Layman's terms". During today's appointment we went over my diagnostic impression, as well as the proposed treatment plan.  According to patient his primary area of pain is in his right hip. 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 He does admit that it did help with his leg discrepancy. He admits that he has had hip injection however this was ineffective. He had physical therapy after surgery which was effective at that time. He denies any recent images.  His second area of pain is his left knee. He admits that he had surgery as a child; age 61 to slow down growth secondary to leg discrepancy.   Today I took the time to provide the patient with information regarding this pain practice. The patient was informed that the practice is divided into two sections: an interventional pain management section, as well as a completely separate and distinct medication management section. I explained that there are procedure days for interventional therapies, and evaluation days for follow-ups and medication management. Because of the amount of documentation required during both, they  are kept separated. This means that there is the possibility that he may be scheduled for a procedure on one day, and medication management the next. I have also informed his that because  of staffing and facility limitations, this practice will no longer take patients for medication management only. To illustrate the reasons for this, I gave the patient the example of surgeons, and how inappropriate it would be to refer a patient to his/her care, just to write for the post-surgical antibiotics on a surgery done by a different surgeon.   In considering the treatment plan options, Mr. Rafter was reminded that I no longer take patients for medication management only. I asked him to let me know if he had no intention of taking advantage of the interventional therapies, so that we could make arrangements to provide this space to someone interested. I also made it clear that undergoing interventional therapies for the purpose of getting pain medications is very inappropriate on the part of a patient, and it will not be tolerated in this practice. This type of behavior would suggest true addiction and therefore it requires referral to an addiction specialist.   Further details on both, my assessment(s), as well as the proposed treatment plan, please see below.  Controlled Substance Pharmacotherapy Assessment REMS (Risk Evaluation and Mitigation Strategy)  Analgesic: None (Last prescription written for this patient was oxycodone/APAP 5/325 one tablet by mouth every 4 hours #30 written on 02/04/2016 and filled on 02/05/2016.) Highest recorded MME/day: 90 mg/day MME/day: 0 mg/day Pill Count: None expected due to no prior prescriptions written by our practice. Lona Millard, RN  06/07/2017  8:21 AM  Sign at close encounter Safety precautions to be maintained throughout the outpatient stay will include: orient to surroundings, keep bed in low position, maintain call bell within reach at all times, provide assistance with transfer out of bed and ambulation.    Pharmacokinetics: Liberation and absorption (onset of action): WNL Distribution (time to peak effect): WNL Metabolism and excretion  (duration of action): WNL         Pharmacodynamics: Desired effects: Analgesia: Mr. Nanda reports >50% benefit. Functional ability: Patient reports that medication allows him to accomplish basic ADLs Clinically meaningful improvement in function (CMIF): Sustained CMIF goals met Perceived effectiveness: Described as relatively effective, allowing for increase in activities of daily living (ADL) Undesirable effects: Side-effects or Adverse reactions: None reported Monitoring: [place] PMP: Online review of the past 6-monthperiod previously conducted. Not applicable at this point since we have not taken over the patient's medication management yet. List of all Serum Drug Screening Test(s):  No results found for: AMPHSCRSER, BARBSCRSER, BENZOSCRSER, COCAINSCRSER, PCPSCRSER, THCSCRSER, OPIATESCRSER, OGrant-[place] PScammonList of all UDS test(s) done:  Lab Results  Component Value Date   SUMMARY FINAL 03/27/2017   Last UDS on record: Summary  Date Value Ref Range Status  03/27/2017 FINAL  Final    Comment:    ==================================================================== TOXASSURE COMP DRUG ANALYSIS,UR ==================================================================== Test                             Result       Flag       Units Drug Absent but Declared for Prescription Verification   Salicylate                     Not Detected UNEXPECTED    Aspirin, as indicated in the declared medication list, is not  always detected even when used as directed.   Ibuprofen                      Not Detected UNEXPECTED    Ibuprofen, as indicated in the declared medication list, is not    always detected even when used as directed. ==================================================================== Test                      Result    Flag   Units      Ref Range   Creatinine              86               mg/dL       >=20 ==================================================================== Declared Medications:  The flagging and interpretation on this report are based on the  following declared medications.  Unexpected results may arise from  inaccuracies in the declared medications.  **Note: The testing scope of this panel does not include small to  moderate amounts of these reported medications:  Aspirin (Aspirin 81)  Ibuprofen  **Note: The testing scope of this panel does not include following  reported medications:  Celecoxib (Celebrex)  Fenofibrate (Tricor)  Glipizide  Insulin (Lantus)  Lisinopril  Metformin  Omega-3 Fatty Acids (Lovaza)  Rosuvastatin (Crestor) ==================================================================== For clinical consultation, please call [phone]. ====================================================================    UDS interpretation: Unexpected findings not considered significantly abnormal.          Medication Assessment Form: Patient introduced to form today Treatment compliance: Treatment may start today if patient agrees with proposed plan. Evaluation of compliance is not applicable at this point Risk Assessment Profile: Aberrant behavior: See initial evaluations. None observed or detected today Comorbid factors increasing risk of overdose: See initial evaluation. No additional risks detected today Medical Psychology Evaluation: Please see scanned results in medical record.     Opioid Risk Tool - 06/07/17 0819      Family History of Substance Abuse   Alcohol Negative   Illegal Drugs Negative   Rx Drugs Negative     Personal History of Substance Abuse   Alcohol Negative   Illegal Drugs Negative   Rx Drugs Negative     Age   Age between 28-45 years  No     History of Preadolescent Sexual Abuse   History of Preadolescent Sexual Abuse Negative or Male     Psychological Disease   Psychological Disease Negative   Depression Negative      Total Score   Opioid Risk Tool Scoring 0   Opioid Risk Interpretation Low Risk     ORT Scoring interpretation table:  Score <3 = Low Risk for SUD  Score between 4-7 = Moderate Risk for SUD  Score >8 = High Risk for Opioid Abuse   Risk Mitigation Strategies:  Patient opioid safety counseling: Completed today. Counseling provided to patient as per "Patient Counseling Document". Document signed by patient, attesting to counseling and understanding Patient-Prescriber Agreement (PPA): Obtained today.  Controlled substance notification to other providers: Written and sent today.  Pharmacologic Plan: Today we may be taking over the patient's pharmacological regimen. See below From this point on, Mr. Sanger medication agreement should be considered active.  Laboratory Chemistry  Inflammation Markers (CRP: Acute Phase) (ESR: Chronic Phase) Lab Results  Component Value Date   CRP 1.0 03/27/2017   ESRSEDRATE 2 03/27/2017  Renal Function Markers Lab Results  Component Value Date   BUN 13 03/27/2017   CREATININE 0.80 03/27/2017   GFRAA 111 03/27/2017   GFRNONAA 96 03/27/2017                 Hepatic Function Markers Lab Results  Component Value Date   AST 17 03/27/2017   ALT 18 03/27/2017   ALBUMIN 4.5 03/27/2017   ALKPHOS 44 03/27/2017                 Electrolytes Lab Results  Component Value Date   NA 143 03/27/2017   K 4.8 03/27/2017   CL 107 (H) 03/27/2017   CALCIUM 9.7 03/27/2017   MG 1.8 03/27/2017                 Neuropathy Markers Lab Results  Component Value Date   VITAMINB12 <150 (L) 03/27/2017                 Bone Pathology Markers Lab Results  Component Value Date   ALKPHOS 44 03/27/2017   25OHVITD1 18 (L) 03/27/2017   25OHVITD2 <1.0 03/27/2017   25OHVITD3 18 03/27/2017   CALCIUM 9.7 03/27/2017                 Coagulation Parameters Lab Results  Component Value Date   PLT 170 01/26/2016                 Cardiovascular  Markers Lab Results  Component Value Date   HGB 14.6 01/26/2016   HCT 42.4 01/26/2016                 Note: Lab results reviewed.  Recent Diagnostic Imaging Review  Lumbosacral Imaging: Lumbar DG Bending views:  Results for orders placed during the hospital encounter of 03/27/17  DG Lumbar Spine Complete W/Bend   Narrative CLINICAL DATA:  Low back pain and right hip pain, initial encounter  EXAM: LUMBAR SPINE - COMPLETE WITH BENDING VIEWS  COMPARISON:  None.  FINDINGS: Five lumbar type vertebral bodies are well visualized. Vertebral body height is well maintained. No pars defects are seen. Mild degenerative retrolisthesis of L4 on L5 is noted. Disc space narrowing is noted at this level as well. Aortic calcifications are noted. No instability on flexion and extension is seen.  IMPRESSION: Degenerative changes with retrolisthesis of L4 on L5.  No acute instability is noted.  No acute abnormality is seen.   Electronically Signed   By: Inez Catalina M.D.   On: 03/27/2017 11:13    Sacroiliac Joint Imaging: Sacroiliac Joint DG:  Results for orders placed during the hospital encounter of 03/27/17  DG Si Joints   Narrative CLINICAL DATA:  Pelvic pain  EXAM: BILATERAL SACROILIAC JOINTS - 3+ VIEW  COMPARISON:  None.  FINDINGS: The pelvic ring is intact. Postsurgical changes are noted in the right hip joint as well as the prostate. Sacroiliac joints are patent. No significant sclerosis or osteophytic changes are noted.  IMPRESSION: Patent sacroiliac joints bilaterally.   Electronically Signed   By: Inez Catalina M.D.   On: 03/27/2017 11:12    Hip Imaging: Hip-R DG 2-3 views:  Results for orders placed during the hospital encounter of 03/27/17  DG HIP UNILAT W OR W/O PELVIS 2-3 VIEWS RIGHT   Narrative CLINICAL DATA:  Right hip pain  EXAM: DG HIP (WITH OR WITHOUT PELVIS) 2-3V RIGHT  COMPARISON:  None.  FINDINGS: Pelvic ring is intact. Postsurgical changes  are noted consistent  with prior hip replacement. No acute fracture or dislocation is seen. Degenerative changes of left hip joint are noted. Changes of prior Urolift placement are noted.  IMPRESSION: Postsurgical and degenerative changes.  No acute abnormality noted.   Electronically Signed   By: Inez Catalina M.D.   On: 03/27/2017 11:09    Complexity Note: Imaging results reviewed. Results shared with Mr. Fujiwara, using Layman's terms.                         Meds   Current Outpatient Prescriptions:  .  aspirin EC 81 MG tablet, Take 81 mg by mouth daily., Disp: , Rfl:  .  celecoxib (CELEBREX) 100 MG capsule, Take 1 capsule (100 mg total) by mouth daily., Disp: 30 capsule, Rfl: 0 .  fenofibrate (TRICOR) 145 MG tablet, Take 145 mg by mouth every evening. , Disp: , Rfl:  .  glipiZIDE (GLUCOTROL XL) 10 MG 24 hr tablet, Take 10 mg by mouth daily., Disp: , Rfl: 0 .  ibuprofen (ADVIL,MOTRIN) 200 MG tablet, Take 200 mg by mouth every 4 (four) hours as needed., Disp: , Rfl:  .  insulin glargine (LANTUS) 100 UNIT/ML injection, Inject 50 Units into the skin at bedtime. , Disp: , Rfl:  .  lisinopril (PRINIVIL,ZESTRIL) 10 MG tablet, Take 10 mg by mouth daily., Disp: , Rfl:  .  metFORMIN (GLUCOPHAGE) 1000 MG tablet, Take 1,000 mg by mouth 2 (two) times daily. , Disp: , Rfl:  .  omega-3 acid ethyl esters (LOVAZA) 1 g capsule, Take 2 g by mouth 2 (two) times daily., Disp: , Rfl:  .  rosuvastatin (CRESTOR) 20 MG tablet, Take 20 mg by mouth every evening. , Disp: , Rfl:  .  Calcium Carb-Cholecalciferol (CALCIUM PLUS D3 ABSORBABLE) [phone] MG-UNIT CAPS, Take 1 capsule by mouth daily with breakfast., Disp: 90 capsule, Rfl: 0 .  Cholecalciferol 5000 units capsule, Take 1 capsule (5,000 Units total) by mouth daily., Disp: 90 capsule, Rfl: 0 .  cyanocobalamin ([employer] VITAMIN B12) 2000 MCG tablet, Take 1 tablet (2,000 mcg total) by mouth daily., Disp: 90 tablet, Rfl: 0 .  [START ON 06/08/2017] ergocalciferol  (VITAMIN D2) 50000 units capsule, Take 1 capsule (50,000 Units total) by mouth 2 (two) times a week. X 6 weeks., Disp: 12 capsule, Rfl: 0 .  gabapentin (NEURONTIN) 100 MG capsule, Take 1-3 capsules (100-300 mg total) by mouth 3 (three) times daily. Follow written titration schedule., Disp: 90 capsule, Rfl: 0 .  oxyCODONE-acetaminophen (PERCOCET) 5-325 MG tablet, Take 1-2 tablets by mouth once. Take 30 minutes before MRI for back pain., Disp: 2 tablet, Rfl: 0  ROS  Constitutional: Denies any fever or chills Gastrointestinal: No reported hemesis, hematochezia, vomiting, or acute GI distress Musculoskeletal: Denies any acute onset joint swelling, redness, loss of ROM, or weakness Neurological: No reported episodes of acute onset apraxia, aphasia, dysarthria, agnosia, amnesia, paralysis, loss of coordination, or loss of consciousness  Allergies  Mr. Seymour is allergic to morphine and related and niacin and related.  [place]  Drug: Mr. Apodaca  reports that he uses drugs, including Marijuana. Alcohol:  reports that he drinks alcohol. Tobacco:  reports that he quit smoking about 3 years ago. His smoking use included Cigarettes. He has a 60.00 pack-year smoking history. He has never used smokeless tobacco. Medical:  has a past medical history of Arthritis; Blind left eye; BPH (benign prostatic hyperplasia); Complication of anesthesia; Diabetes mellitus ([place]); Frequent urination; History of bladder infections (since  nov 2016); History of kidney stones; HLD (hyperlipidemia); Joint pain; Reflux; and Skin irritation. Surgical: Mr. Scheer  has a past surgical history that includes Total hip arthroplasty (Right, 2009); Bladder surgery (2013, 14, 15, 16); nissan fundoplication (2505); Brain surgery (2005); mass removed from right hip as child; Cystoscopy with litholapaxy (N/A, 02/04/2016); Cystoscopy with insertion of urolift (N/A, 02/04/2016); and Holmium laser application (N/A, 3/97/6734). Family: family  history includes Melanoma in his mother; Tuberculosis in his mother.  Constitutional Exam  General appearance: Well nourished, well developed, and well hydrated. In no apparent acute distress Vitals:   06/07/17 0812  BP: (!) 141/80  Pulse: (!) 50  Resp: 16  Temp: 97.8 F (36.6 C)  SpO2: 99%  Weight: 208 lb (94.3 kg)  Height: 5' 7"  (1.702 m)   BMI Assessment: Estimated body mass index is 32.58 kg/m as calculated from the following:   Height as of this encounter: 5' 7"  (1.702 m).   Weight as of this encounter: 208 lb (94.3 kg).  BMI interpretation table: BMI level Category Range association with higher incidence of chronic pain  <18 kg/m2 Underweight   18.5-24.9 kg/m2 Ideal body weight   25-29.9 kg/m2 Overweight Increased incidence by 20%  30-34.9 kg/m2 Obese (Class I) Increased incidence by 68%  35-39.9 kg/m2 Severe obesity (Class II) Increased incidence by 136%  >40 kg/m2 Extreme obesity (Class III) Increased incidence by 254%   BMI Readings from Last 4 Encounters:  06/07/17 32.58 kg/m  03/27/17 32.26 kg/m  04/04/16 32.67 kg/m  03/17/16 32.51 kg/m   Wt Readings from Last 4 Encounters:  06/07/17 208 lb (94.3 kg)  03/27/17 206 lb (93.4 kg)  04/04/16 208 lb 9.6 oz (94.6 kg)  03/17/16 207 lb 9.6 oz (94.2 kg)  Psych/Mental status: Alert, oriented x 3 (person, place, & time)       Eyes: PERLA Respiratory: No evidence of acute respiratory distress  Cervical Spine Area Exam  Skin & Axial Inspection: No masses, redness, edema, swelling, or associated skin lesions Alignment: Symmetrical Functional ROM: Unrestricted ROM      Stability: No instability detected Muscle Tone/Strength: Functionally intact. No obvious neuro-muscular anomalies detected. Sensory (Neurological): Unimpaired Palpation: No palpable anomalies              Upper Extremity (UE) Exam    Side: Right upper extremity  Side: Left upper extremity  Skin & Extremity Inspection: Skin color, temperature, and  hair growth are WNL. No peripheral edema or cyanosis. No masses, redness, swelling, asymmetry, or associated skin lesions. No contractures.  Skin & Extremity Inspection: Skin color, temperature, and hair growth are WNL. No peripheral edema or cyanosis. No masses, redness, swelling, asymmetry, or associated skin lesions. No contractures.  Functional ROM: Unrestricted ROM          Functional ROM: Unrestricted ROM          Muscle Tone/Strength: Functionally intact. No obvious neuro-muscular anomalies detected.  Muscle Tone/Strength: Functionally intact. No obvious neuro-muscular anomalies detected.  Sensory (Neurological): Unimpaired          Sensory (Neurological): Unimpaired          Palpation: No palpable anomalies              Palpation: No palpable anomalies              Specialized Test(s): Deferred         Specialized Test(s): Deferred          Thoracic Spine Area Exam  Skin & Axial  Inspection: No masses, redness, or swelling Alignment: Symmetrical Functional ROM: Unrestricted ROM Stability: No instability detected Muscle Tone/Strength: Functionally intact. No obvious neuro-muscular anomalies detected. Sensory (Neurological): Unimpaired Muscle strength & Tone: No palpable anomalies  Lumbar Spine Area Exam  Skin & Axial Inspection: No masses, redness, or swelling Alignment: Symmetrical Functional ROM: Diminished ROM      Stability: No instability detected Muscle Tone/Strength: Functionally intact. No obvious neuro-muscular anomalies detected. Sensory (Neurological): Movement-associated discomfort Palpation: No palpable anomalies       Provocative Tests: Lumbar Hyperextension and rotation test: evaluation deferred today       Lumbar Lateral bending test: evaluation deferred today       Patrick's Maneuver: evaluation deferred today                    Gait & Posture Assessment  Ambulation: Unassisted Gait: Antalgic Posture: WNL   Lower Extremity Exam    Side: Right lower extremity   Side: Left lower extremity  Skin & Extremity Inspection: Skin color, temperature, and hair growth are WNL. No peripheral edema or cyanosis. No masses, redness, swelling, asymmetry, or associated skin lesions. No contractures.  Skin & Extremity Inspection: Skin color, temperature, and hair growth are WNL. No peripheral edema or cyanosis. No masses, redness, swelling, asymmetry, or associated skin lesions. No contractures.  Functional ROM: Decreased ROM          Functional ROM: Unrestricted ROM          Muscle Tone/Strength: Functionally intact. No obvious neuro-muscular anomalies detected.  Muscle Tone/Strength: Functionally intact. No obvious neuro-muscular anomalies detected.  Sensory (Neurological): Arthropathic arthralgia  Sensory (Neurological): Unimpaired  Palpation: No palpable anomalies  Palpation: No palpable anomalies   Assessment & Plan  Primary Diagnosis & Pertinent Problem List: The primary encounter diagnosis was Chronic lower extremity pain (Primary Area of Pain) (Right). Diagnoses of Chronic hip pain S/P THR (Right), Chronic low back pain (Secondary Area of Pain) (Right), Chronic sacroiliac joint pain (Right), DDD (degenerative disc disease), lumbar, Grade 1 Retrolisthesis of L4 over L5, Chronic pain syndrome, NSAID long-term use, Disorder of skeletal system, Pharmacologic therapy, Problems influencing health status, Opiate use, Vitamin D insufficiency, Vitamin B12 deficiency, Other intervertebral disc degeneration, lumbar region, Neurogenic pain, Idiopathic peripheral neuropathy, and Osteoarthritis of hip (Right) (s/p THR) were also pertinent to this visit.  Visit Diagnosis: 1. Chronic lower extremity pain (Primary Area of Pain) (Right)   2. Chronic hip pain S/P THR (Right)   3. Chronic low back pain (Secondary Area of Pain) (Right)   4. Chronic sacroiliac joint pain (Right)   5. DDD (degenerative disc disease), lumbar   6. Grade 1 Retrolisthesis of L4 over L5   7. Chronic pain  syndrome   8. NSAID long-term use   9. Disorder of skeletal system   10. Pharmacologic therapy   11. Problems influencing health status   12. Opiate use   13. Vitamin D insufficiency   14. Vitamin B12 deficiency   15. Other intervertebral disc degeneration, lumbar region   16. Neurogenic pain   17. Idiopathic peripheral neuropathy   18. Osteoarthritis of hip (Right) (s/p THR)    Problems updated and reviewed during this visit: Problem  Neurogenic Pain  Idiopathic Peripheral Neuropathy  Osteoarthritis of hip (Right) (s/p THR)   Time Note: Greater than 50% of the 40 minute(s) of face-to-face time spent with Mr. Stuck, was spent in counseling/coordination of care regarding: the appropriate use of the pain scale, Mr. Vandenberghe primary cause  of pain, the results of his recent test(s), the significance of each one oth the test(s) anomalies and it's corresponding characteristic pain pattern(s), the treatment plan, treatment alternatives, the risks and possible complications of proposed treatment, medication side effects, realistic expectations, the goals of pain management (increased in functionality) and the need to collect and read the AVS material.  Plan of Care  Pharmacotherapy (Medications Ordered): Meds ordered this encounter  Medications  . cyanocobalamin ([facility] VITAMIN B12) 2000 MCG tablet    Sig: Take 1 tablet (2,000 mcg total) by mouth daily.    Dispense:  90 tablet    Refill:  0    Do not place medication on "Automatic Refill". Fill one day early if pharmacy is closed on scheduled refill date.  . Calcium Carb-Cholecalciferol (CALCIUM PLUS D3 ABSORBABLE) [phone] MG-UNIT CAPS    Sig: Take 1 capsule by mouth daily with breakfast.    Dispense:  90 capsule    Refill:  0    Do not place medication on "Automatic Refill". Fill one day early if pharmacy is closed on scheduled refill date.  . ergocalciferol (VITAMIN D2) 50000 units capsule    Sig: Take 1 capsule (50,000 Units total)  by mouth 2 (two) times a week. X 6 weeks.    Dispense:  12 capsule    Refill:  0    Do not add this medication to the electronic "Automatic Refill" notification system. Patient may have prescription filled one day early if pharmacy is closed on scheduled refill date.  . Cholecalciferol 5000 units capsule    Sig: Take 1 capsule (5,000 Units total) by mouth daily.    Dispense:  90 capsule    Refill:  0    Do not place medication on "Automatic Refill". Fill one day early if pharmacy is closed on scheduled refill date.  . gabapentin (NEURONTIN) 100 MG capsule    Sig: Take 1-3 capsules (100-300 mg total) by mouth 3 (three) times daily. Follow written titration schedule.    Dispense:  90 capsule    Refill:  0    Do not place medication on "Automatic Refill". Fill one day early if pharmacy is closed on scheduled refill date.  . celecoxib (CELEBREX) 100 MG capsule    Sig: Take 1 capsule (100 mg total) by mouth daily.    Dispense:  30 capsule    Refill:  0    Do not place medication on "Automatic Refill". Fill one day early if pharmacy is closed on scheduled refill date.  Marland Kitchen oxyCODONE-acetaminophen (PERCOCET) 5-325 MG tablet    Sig: Take 1-2 tablets by mouth once. Take 30 minutes before MRI for back pain.    Dispense:  2 tablet    Refill:  0    For acute post-operative pain. Not to be refilled. Have a driver for MRI.    Procedure Orders     FEMORAL NERVE BLOCK Lab Orders  No laboratory test(s) ordered today    Imaging Orders     MR LUMBAR SPINE WO CONTRAST  Referral Orders     Ambulatory referral to Physical Therapy  Pharmacological management options:  Opioid Analgesics: We'll take over management today. See above orders Membrane stabilizer: Today we will be starting a Neurontin 100 mg trial. Muscle relaxant: We have discussed the possibility of a trial NSAID: We have discussed the possibility of a trial Other analgesic(s): To be determined at a later time   Interventional  management options: Planned, scheduled, and/or pending:  Prescription for Percocet 5/325 #2 pills to be taken 1-2 tablets before MRI. Diagnostic right-sided femoral nerve + obturator nerve block under fluoroscopic guidance and IV sedation    Considering:   Diagnostic right-sided sacroiliac joint injection Diagnostic right-sided femoral nerve block Possible  right-sided hip radiofrequency ablation Diagnostic left intra-articular knee injection Diagnostic left genicular nerve block Possible left-sided knee radiofrequency ablation    PRN Procedures:   None at this time   Provider-requested follow-up: Return for Procedure (w/ sedation): (R) Femoral + Obturator Nerve block.  No future appointments.  Primary Care Physician: Letta Median, MD Location: [facility] Note by: Gaspar Cola, MD Date: 06/07/2017; Time: 10:24 AM

## 2017-06-07 ENCOUNTER — Other Ambulatory Visit: Payer: Self-pay | Admitting: Pain Medicine

## 2017-06-07 ENCOUNTER — Ambulatory Visit: Payer: Medicare HMO | Attending: Pain Medicine | Admitting: Pain Medicine

## 2017-06-07 ENCOUNTER — Encounter: Payer: Self-pay | Admitting: Pain Medicine

## 2017-06-07 VITALS — BP 141/80 | HR 50 | Temp 97.8°F | Resp 16 | Ht 67.0 in | Wt 208.0 lb

## 2017-06-07 DIAGNOSIS — M5136 Other intervertebral disc degeneration, lumbar region: Secondary | ICD-10-CM | POA: Insufficient documentation

## 2017-06-07 DIAGNOSIS — M1611 Unilateral primary osteoarthritis, right hip: Secondary | ICD-10-CM | POA: Insufficient documentation

## 2017-06-07 DIAGNOSIS — E785 Hyperlipidemia, unspecified: Secondary | ICD-10-CM | POA: Diagnosis not present

## 2017-06-07 DIAGNOSIS — M533 Sacrococcygeal disorders, not elsewhere classified: Secondary | ICD-10-CM | POA: Insufficient documentation

## 2017-06-07 DIAGNOSIS — M4310 Spondylolisthesis, site unspecified: Secondary | ICD-10-CM

## 2017-06-07 DIAGNOSIS — E538 Deficiency of other specified B group vitamins: Secondary | ICD-10-CM | POA: Insufficient documentation

## 2017-06-07 DIAGNOSIS — E119 Type 2 diabetes mellitus without complications: Secondary | ICD-10-CM | POA: Insufficient documentation

## 2017-06-07 DIAGNOSIS — M899 Disorder of bone, unspecified: Secondary | ICD-10-CM | POA: Diagnosis not present

## 2017-06-07 DIAGNOSIS — H5462 Unqualified visual loss, left eye, normal vision right eye: Secondary | ICD-10-CM | POA: Diagnosis not present

## 2017-06-07 DIAGNOSIS — Z79899 Other long term (current) drug therapy: Secondary | ICD-10-CM

## 2017-06-07 DIAGNOSIS — G894 Chronic pain syndrome: Secondary | ICD-10-CM | POA: Insufficient documentation

## 2017-06-07 DIAGNOSIS — Z87440 Personal history of urinary (tract) infections: Secondary | ICD-10-CM | POA: Diagnosis not present

## 2017-06-07 DIAGNOSIS — F1190 Opioid use, unspecified, uncomplicated: Secondary | ICD-10-CM | POA: Diagnosis not present

## 2017-06-07 DIAGNOSIS — Z794 Long term (current) use of insulin: Secondary | ICD-10-CM | POA: Insufficient documentation

## 2017-06-07 DIAGNOSIS — R3129 Other microscopic hematuria: Secondary | ICD-10-CM | POA: Diagnosis not present

## 2017-06-07 DIAGNOSIS — Z791 Long term (current) use of non-steroidal anti-inflammatories (NSAID): Secondary | ICD-10-CM | POA: Insufficient documentation

## 2017-06-07 DIAGNOSIS — M4316 Spondylolisthesis, lumbar region: Secondary | ICD-10-CM | POA: Diagnosis not present

## 2017-06-07 DIAGNOSIS — N400 Enlarged prostate without lower urinary tract symptoms: Secondary | ICD-10-CM | POA: Insufficient documentation

## 2017-06-07 DIAGNOSIS — M25551 Pain in right hip: Secondary | ICD-10-CM

## 2017-06-07 DIAGNOSIS — M792 Neuralgia and neuritis, unspecified: Secondary | ICD-10-CM | POA: Insufficient documentation

## 2017-06-07 DIAGNOSIS — Z96641 Presence of right artificial hip joint: Secondary | ICD-10-CM | POA: Diagnosis not present

## 2017-06-07 DIAGNOSIS — M5441 Lumbago with sciatica, right side: Secondary | ICD-10-CM | POA: Diagnosis not present

## 2017-06-07 DIAGNOSIS — G8929 Other chronic pain: Secondary | ICD-10-CM

## 2017-06-07 DIAGNOSIS — K219 Gastro-esophageal reflux disease without esophagitis: Secondary | ICD-10-CM | POA: Diagnosis not present

## 2017-06-07 DIAGNOSIS — Z789 Other specified health status: Secondary | ICD-10-CM | POA: Diagnosis not present

## 2017-06-07 DIAGNOSIS — M79604 Pain in right leg: Secondary | ICD-10-CM | POA: Diagnosis not present

## 2017-06-07 DIAGNOSIS — Z7982 Long term (current) use of aspirin: Secondary | ICD-10-CM | POA: Diagnosis not present

## 2017-06-07 DIAGNOSIS — E559 Vitamin D deficiency, unspecified: Secondary | ICD-10-CM

## 2017-06-07 DIAGNOSIS — M545 Low back pain: Secondary | ICD-10-CM | POA: Diagnosis not present

## 2017-06-07 DIAGNOSIS — Z87891 Personal history of nicotine dependence: Secondary | ICD-10-CM | POA: Insufficient documentation

## 2017-06-07 DIAGNOSIS — G609 Hereditary and idiopathic neuropathy, unspecified: Secondary | ICD-10-CM | POA: Diagnosis not present

## 2017-06-07 MED ORDER — ERGOCALCIFEROL 1.25 MG (50000 UT) PO CAPS: 50000.0000 [IU] | ORAL_CAPSULE | ORAL | 0 refills | Status: AC

## 2017-06-07 MED ORDER — OXYCODONE-ACETAMINOPHEN 5-325 MG PO TABS: 1.0000 | ORAL_TABLET | Freq: Once | ORAL | 0 refills | Status: AC

## 2017-06-07 MED ORDER — CHOLECALCIFEROL 125 MCG (5000 UT) PO CAPS: 5000.0000 [IU] | ORAL_CAPSULE | Freq: Every day | ORAL | 0 refills | Status: AC

## 2017-06-07 MED ORDER — CYANOCOBALAMIN 2000 MCG PO TABS: 2000.0000 ug | ORAL_TABLET | Freq: Every day | ORAL | 0 refills | Status: AC

## 2017-06-07 MED ORDER — GABAPENTIN 100 MG PO CAPS: 100.0000 mg | ORAL_CAPSULE | Freq: Three times a day (TID) | ORAL | 0 refills | Status: DC

## 2017-06-07 MED ORDER — CALCIUM PLUS D3 ABSORBABLE 600-2500 MG-UNIT PO CAPS: 1.0000 | ORAL_CAPSULE | Freq: Every day | ORAL | 0 refills | Status: DC

## 2017-06-07 MED ORDER — CELECOXIB 100 MG PO CAPS: 100.0000 mg | ORAL_CAPSULE | Freq: Every day | ORAL | 0 refills | Status: DC

## 2017-06-07 NOTE — Progress Notes (Signed)
Safety precautions to be maintained throughout the outpatient stay will include: orient to surroundings, keep bed in low position, maintain call bell within reach at all times, provide assistance with transfer out of bed and ambulation.  

## 2017-06-07 NOTE — Patient Instructions (Addendum)
____________________________________________________________________________________________  Pain Scale  Introduction: The pain score used by this practice is the Verbal Numerical Rating Scale (VNRS-11). This is an 11-point scale. It is for adults and children 10 years or older. There are significant differences in how the pain score is reported, used, and applied. Forget everything you learned in the past and learn this scoring system.  General Information: The scale should reflect your current level of pain. Unless you are specifically asked for the level of your worst pain, or your average pain. If you are asked for one of these two, then it should be understood that it is over the past 24 hours.  Basic Activities of Daily Living (ADL): Personal hygiene, dressing, eating, transferring, and using restroom.  Instructions: Most patients tend to report their level of pain as a combination of two factors, their physical pain and their psychosocial pain. This last one is also known as "suffering" and it is reflection of how physical pain affects you socially and psychologically. From now on, report them separately. From this point on, when asked to report your pain level, report only your physical pain. Use the following table for reference.  Pain Clinic Pain Levels (0-5/10)  Pain Level Score  Description  No Pain 0   Mild pain 1 Nagging, annoying, but does not interfere with basic activities of daily living (ADL). Patients are able to eat, bathe, get dressed, toileting (being able to get on and off the toilet and perform personal hygiene functions), transfer (move in and out of bed or a chair without assistance), and maintain continence (able to control bladder and bowel functions). Blood pressure and heart rate are unaffected. A normal heart rate for a healthy adult ranges from 60 to 100 bpm (beats per minute).   Mild to moderate pain 2 Noticeable and distracting. Impossible to hide from other  people. More frequent flare-ups. Still possible to adapt and function close to normal. It can be very annoying and may have occasional stronger flare-ups. With discipline, patients may get used to it and adapt.   Moderate pain 3 Interferes significantly with activities of daily living (ADL). It becomes difficult to feed, bathe, get dressed, get on and off the toilet or to perform personal hygiene functions. Difficult to get in and out of bed or a chair without assistance. Very distracting. With effort, it can be ignored when deeply involved in activities.   Moderately severe pain 4 Impossible to ignore for more than a few minutes. With effort, patients may still be able to manage work or participate in some social activities. Very difficult to concentrate. Signs of autonomic nervous system discharge are evident: dilated pupils (mydriasis); mild sweating (diaphoresis); sleep interference. Heart rate becomes elevated (>115 bpm). Diastolic blood pressure (lower number) rises above 100 mmHg. Patients find relief in laying down and not moving.   Severe pain 5 Intense and extremely unpleasant. Associated with frowning face and frequent crying. Pain overwhelms the senses.  Ability to do any activity or maintain social relationships becomes significantly limited. Conversation becomes difficult. Pacing back and forth is common, as getting into a comfortable position is nearly impossible. Pain wakes you up from deep sleep. Physical signs will be obvious: pupillary dilation; increased sweating; goosebumps; brisk reflexes; cold, clammy hands and feet; nausea, vomiting or dry heaves; loss of appetite; significant sleep disturbance with inability to fall asleep or to remain asleep. When persistent, significant weight loss is observed due to the complete loss of appetite and sleep deprivation.  Blood   pressure and heart rate becomes significantly elevated. Caution: If elevated blood pressure triggers a pounding headache  associated with blurred vision, then the patient should immediately seek attention at an urgent or emergency care unit, as these may be signs of an impending stroke.    Emergency Department Pain Levels (6-10/10)  Emergency Room Pain 6 Severely limiting. Requires emergency care and should not be seen or managed at an outpatient pain management facility. Communication becomes difficult and requires great effort. Assistance to reach the emergency department may be required. Facial flushing and profuse sweating along with potentially dangerous increases in heart rate and blood pressure will be evident.   Distressing pain 7 Self-care is very difficult. Assistance is required to transport, or use restroom. Assistance to reach the emergency department will be required. Tasks requiring coordination, such as bathing and getting dressed become very difficult.   Disabling pain 8 Self-care is no longer possible. At this level, pain is disabling. The individual is unable to do even the most "basic" activities such as walking, eating, bathing, dressing, transferring to a bed, or toileting. Fine motor skills are lost. It is difficult to think clearly.   Incapacitating pain 9 Pain becomes incapacitating. Thought processing is no longer possible. Difficult to remember your own name. Control of movement and coordination are lost.   The worst pain imaginable 10 At this level, most patients pass out from pain. When this level is reached, collapse of the autonomic nervous system occurs, leading to a sudden drop in blood pressure and heart rate. This in turn results in a temporary and dramatic drop in blood flow to the brain, leading to a loss of consciousness. Fainting is one of the body's self defense mechanisms. Passing out puts the brain in a calmed state and causes it to shut down for a while, in order to begin the healing process.    Summary: 1. Refer to this scale when providing [place] with your pain level. 2. Be  accurate and careful when reporting your pain level. This will help with your care. 3. Over-reporting your pain level will lead to loss of credibility. 4. Even a level of 1/10 means that there is pain and will be treated at our facility. 5. High, inaccurate reporting will be documented as "Symptom Exaggeration", leading to loss of credibility and suspicions of possible secondary gains such as obtaining more narcotics, or wanting to appear disabled, for fraudulent reasons. 6. Only pain levels of 5 or below will be seen at our facility. 7. Pain levels of 6 and above will be sent to the Emergency Department and the appointment cancelled. ____________________________________________________________________________________________   ____________________________________________________________________________________________  Medication Rules  Applies to: All patients receiving prescriptions (written or electronic).  Pharmacy of record: Pharmacy where electronic prescriptions will be sent. If written prescriptions are taken to a different pharmacy, please inform the nursing staff. The pharmacy listed in the electronic medical record should be the one where you would like electronic prescriptions to be sent.  Prescription refills: Only during scheduled appointments. Applies to both, written and electronic prescriptions.  NOTE: The following applies primarily to controlled substances (Opioid* Pain Medications).   Patient's responsibilities: 1. Pain Pills: Bring all pain pills to every appointment (except for procedure appointments). 2. Pill Bottles: Bring pills in original pharmacy bottle. Always bring newest bottle. Bring bottle, even if empty. 3. Medication refills: You are responsible for knowing and keeping track of what medications you need refilled. The day before your appointment, write a list of all  prescriptions that need to be refilled. Bring that list to your appointment and give it to the  admitting nurse. Prescriptions will be written only during appointments. If you forget a medication, it will not be "Called in", "Faxed", or "electronically sent". You will need to get another appointment to get these prescribed. 4. Prescription Accuracy: You are responsible for carefully inspecting your prescriptions before leaving our office. Have the discharge nurse carefully go over each prescription with you, before taking them home. Make sure that your name is accurately spelled, that your address is correct. Check the name and dose of your medication to make sure it is accurate. Check the number of pills, and the written instructions to make sure they are clear and accurate. Make sure that you are given enough medication to last until your next medication refill appointment. 5. Taking Medication: Take medication as prescribed. Never take more pills than instructed. Never take medication more frequently than prescribed. Taking less pills or less frequently is permitted and encouraged, when it comes to controlled substances (written prescriptions).  6. Inform other Doctors: Always inform, all of your healthcare providers, of all the medications you take. 7. Pain Medication from other Providers: You are not allowed to accept any additional pain medication from any other Doctor or Healthcare provider. There are two exceptions to this rule. (see below) In the event that you require additional pain medication, you are responsible for notifying us, as stated below. 8. Medication Agreement: You are responsible for carefully reading and following our Medication Agreement. This must be signed before receiving any prescriptions from our practice. Safely store a copy of your signed Agreement. Violations to the Agreement will result in no further prescriptions. (Additional copies of our Medication Agreement are available upon request.) 9. Laws, Rules, & Regulations: All patients are expected to follow all Federal  and [employer], Rules, [employer]. Ignorance of the Laws does not constitute a valid excuse. The use of any illegal substances is prohibited. 10. Adopted CDC guidelines & recommendations: Target dosing levels will be at or below 60 MME/day. Use of benzodiazepines** is not recommended.  Exceptions: There are only two exceptions to the rule of not receiving pain medications from other Healthcare Providers. 1. Exception #1 (Emergencies): In the event of an emergency (i.e.: accident requiring emergency care), you are allowed to receive additional pain medication. However, you are responsible for: As soon as you are able, call our office [phone], at any time of the day or night, and leave a message stating your name, the date and nature of the emergency, and the name and dose of the medication prescribed. In the event that your call is answered by a member of our staff, make sure to document and save the date, time, and the name of the person that took your information.  2. Exception #2 (Planned Surgery): In the event that you are scheduled by another doctor or dentist to have any type of surgery or procedure, you are allowed (for a period no longer than 30 days), to receive additional pain medication, for the acute post-op pain. However, in this case, you are responsible for picking up a copy of our "Post-op Pain Management for Surgeons" handout, and giving it to your surgeon or dentist. This document is available at our office, and does not require an appointment to obtain it. Simply go to our office during business hours (Monday-Thursday from 8:00 AM to 4:00 PM) (Friday 8:00 AM to 12:00 Noon) or if you  have a scheduled appointment with us, prior to your surgery, and ask for it by name. In addition, you will need to provide us with your name, name of your surgeon, type of surgery, and date of procedure or surgery.  *Opioid medications include: morphine, codeine, oxycodone, oxymorphone,  hydrocodone, hydromorphone, meperidine, tramadol, tapentadol, buprenorphine, fentanyl, methadone. **Benzodiazepine medications include: diazepam (Valium), alprazolam (Xanax), clonazepam (Klonopine), lorazepam (Ativan), clorazepate (Tranxene), chlordiazepoxide (Librium), estazolam (Prosom), oxazepam (Serax), temazepam (Restoril), triazolam (Halcion)  ____________________________________________________________________________________________ Pain Management Discharge Instructions  General Discharge Instructions :  If you need to reach your doctor call: Monday-Friday 8:00 am - 4:00 pm at [phone] or toll free [phone].  After clinic hours [phone] to have operator reach doctor.  Bring all of your medication bottles to all your appointments in the pain clinic.  To cancel or reschedule your appointment with Pain Management please remember to call 24 hours in advance to avoid a fee.  Refer to the educational materials which you have been given on: General Risks, I had my Procedure. Discharge Instructions, Post Sedation.  Post Procedure Instructions:  The drugs you were given will stay in your system until tomorrow, so for the next 24 hours you should not drive, make any legal decisions or drink any alcoholic beverages.  You may eat anything you prefer, but it is better to start with liquids then soups and crackers, and gradually work up to solid foods.  Please notify your doctor immediately if you have any unusual bleeding, trouble breathing or pain that is not related to your normal pain.  Depending on the type of procedure that was done, some parts of your body may feel week and/or numb.  This usually clears up by tonight or the next day.  Walk with the use of an assistive device or accompanied by an adult for the 24 hours.  You may use ice on the affected area for the first 24 hours.  Put ice in a Ziploc bag and cover with a towel and place against area 15 minutes on 15  minutes off.  You may switch to heat after 24 hours.Lateral Femoral Cutaneous Nerve Block Patient Information  Description: The lateral femoral cutaneous nerve of the thigh is a purely sensory nerve that can become entrapped or irritated for a number of reasons.  The pain associated with this condition is called meralgia paraesthetica.  Patients affected with this syndrome have burning pain or abnormal sensation along the lateral aspect of the thigh.  The pain can be worsened by prolonged walking, standing, or constrictive garments around the house.   The diagnosis can be confirmed and treatment initiated by blocking the nerve with local anesthetic (like Novocaine).  At times, a steroid solution may be injected at the same time.  The site of injection is through a tiny needle in the left, lower quadrant of the abdomen.   The entire block usually lasts less than 5 minutes.  Conditions which may be treated by lateral femoral cutaneous nerve block:   Meralagia paraesthetica  Preparation for the injection:  1. Do not eat any solid food or dairy products within 8 hours of your appointment.  2. You may drink clear liquids up to 3 hours before appointment.  Clear liquids include water, black coffee, juice or soda. No milk or cream please. 3. You may take your regular medication, including pain medications, with a sip of water before your appointment.  Diabetics should hold regular insulin (if taken separately) and take 1/2 normal NPH dose  the morning of the procedure.  Carry some sugar containing items with you to your appointment. 4. A driver must accompany you and be prepared to drive you home after your procedure. 5. Bring all you current medications with you 6. An IV may be inserted and sedation may be given at the discretion of the physician. 7. A blood pressure cuff, EKG and other monitors will often be applied during the procedure.  Some patients may need to have extra oxygen administered for a  short period. 8. You will be asked to provide medical information, including your allergies and medications, prior to the procedure.  We must know immediately if you are taking blood thinners (like Coumadin/Warfarin) or if you allergic to IV iodine contrast (dye)  We must know if you could possible be pregnant.   Possible side-effects:   Bleeding from needle site  Infection (rate, may require surgery)  Nerve injury (rare)  Numbness and Tingling (temporary)  Light-headedness (temporary)  Pain at injection site (several day)  Decreased blood pressure (rare, temporary)  Weakness in leg (temporary)  Call if you experience:  Hives or difficulty breathing (go to the emergency room)  Inflammation or drainage at the injection site(s)  Please note:  Although the local anesthetic injected can often make your leg feel good for several hours after the injection,  The pain may return.  It takes 3-7 days for steroids to work.  You may not notice any pain relief for at least one week.  If effective, we will often do a series of injections spaced 3-6 weeks apart to maximally decrease your pain.  If you have any questions, please cll [phone] [facility]

## 2017-06-09 ENCOUNTER — Telehealth: Payer: Self-pay | Admitting: *Deleted

## 2017-06-12 ENCOUNTER — Telehealth: Payer: Self-pay | Admitting: *Deleted

## 2017-06-13 ENCOUNTER — Ambulatory Visit: Payer: Medicare HMO

## 2017-06-13 ENCOUNTER — Telehealth: Payer: Self-pay | Admitting: *Deleted

## 2017-06-27 ENCOUNTER — Ambulatory Visit: Payer: Medicare HMO

## 2018-01-21 IMAGING — CT CT RENAL STONE PROTOCOL
1 of 2 series · 13 of 32 positions shown, 17 images · non-contrast
Comparison: None.

CLINICAL DATA: Renal calculi. Nephrolithiasis. Stent placements in
the past. Benign prostatic hypertrophy.

EXAM:
CT ABDOMEN AND PELVIS WITHOUT CONTRAST
TECHNIQUE: Multidetector CT imaging of the abdomen and pelvis was performed
following the standard protocol without IV contrast.

[Series 2: stone standard full · axial · 0.86mm/px · z∈[-114,+341]mm · 13 of 101 slices shown, 17 images]
[im 5/101  soft-tissue]
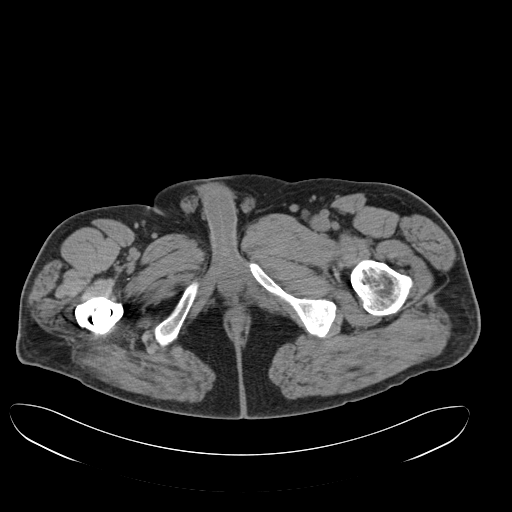
[im 5/101  bone]
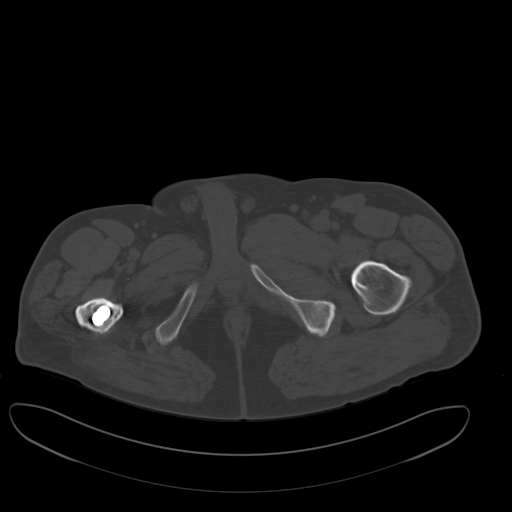
[im 14/101  soft-tissue]
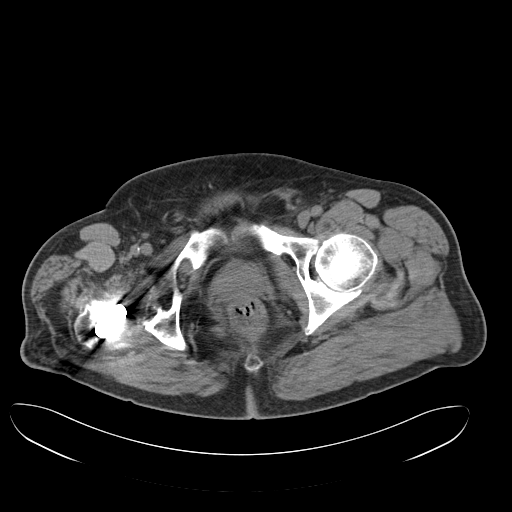
[im 23/101  soft-tissue]
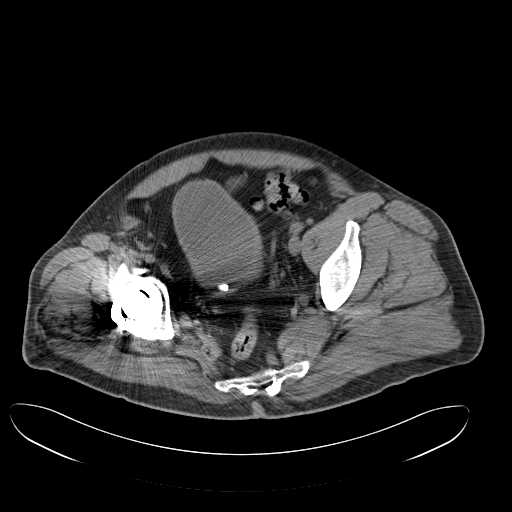
[im 32/101  soft-tissue]
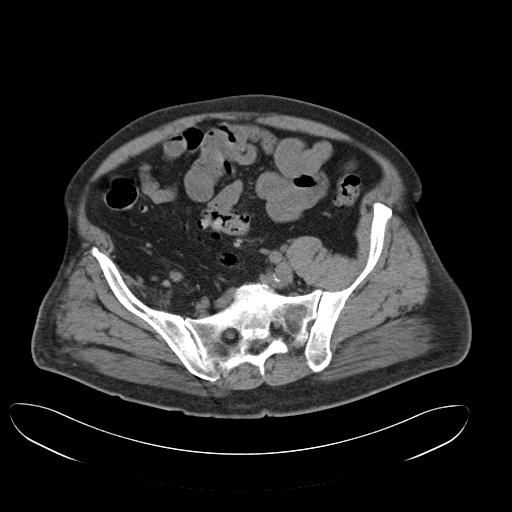
[im 41/101  soft-tissue]
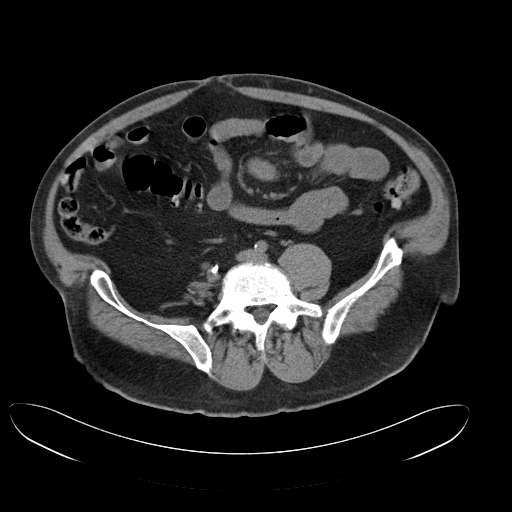
[im 51/101  soft-tissue]
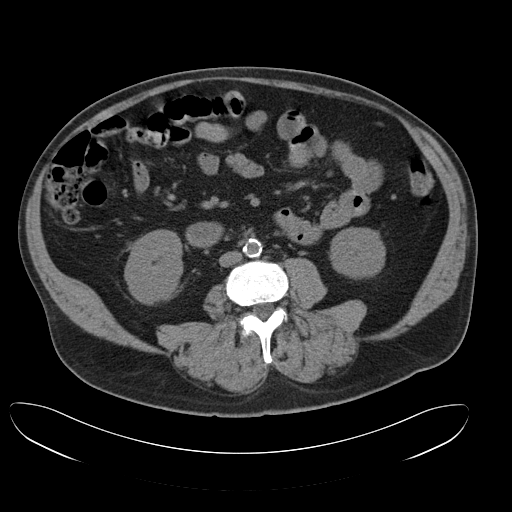
[im 60/101  soft-tissue]
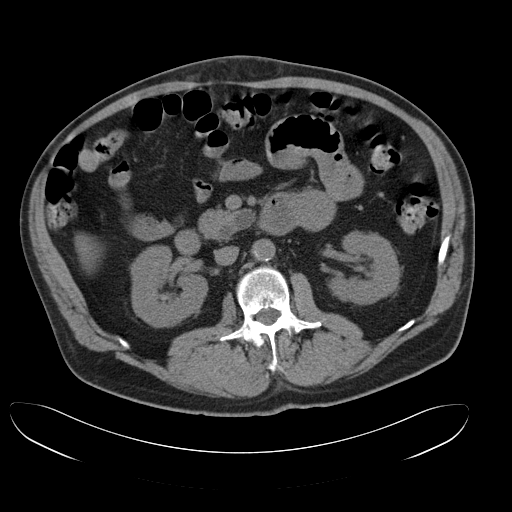
[im 69/101  soft-tissue]
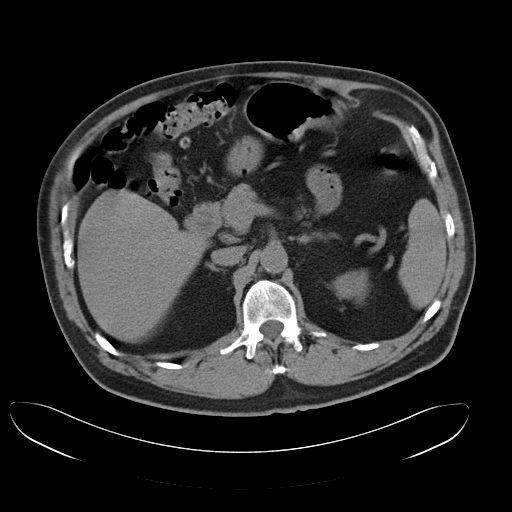
[im 78/101  soft-tissue]
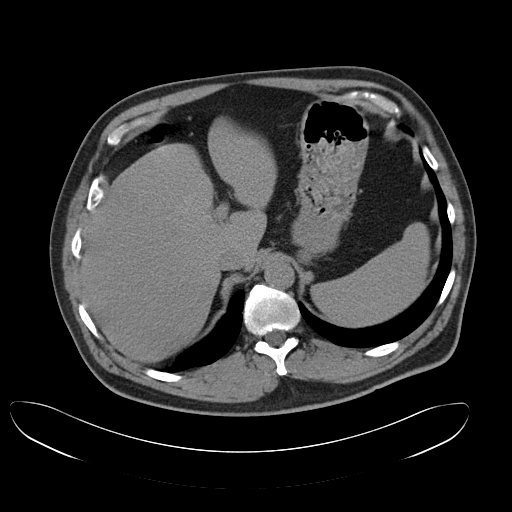
[im 78/101  bone]
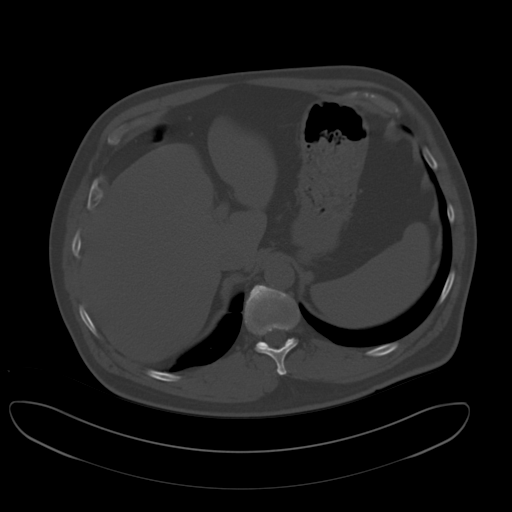
[im 82/101  lung]
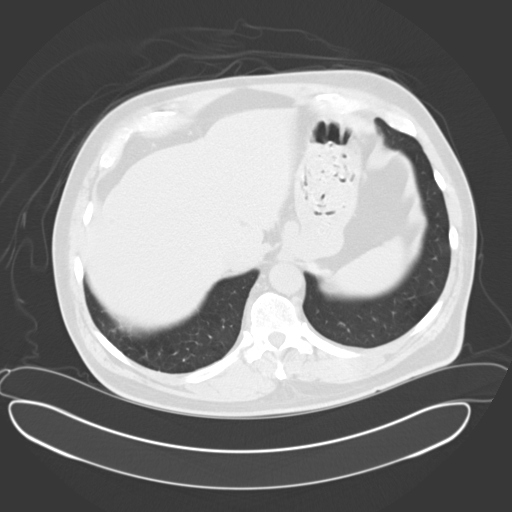
[im 87/101  soft-tissue]
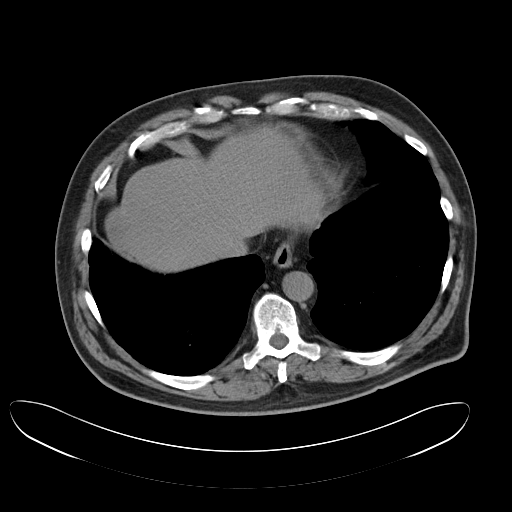
[im 87/101  lung]
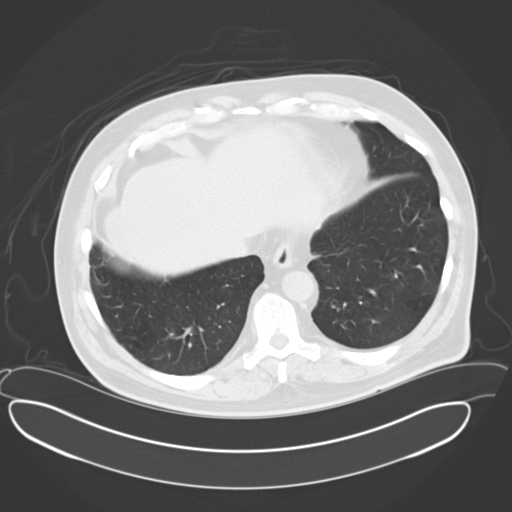
[im 91/101  lung]
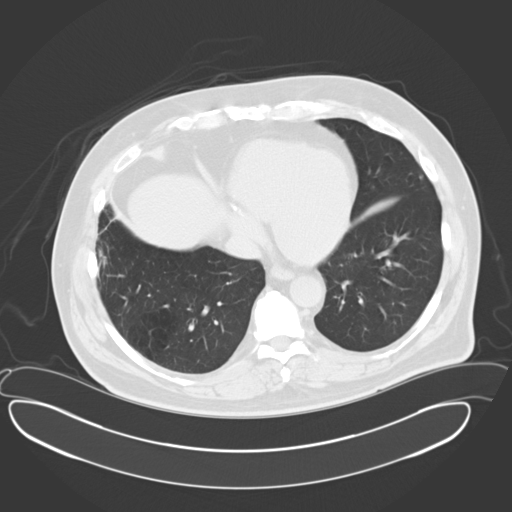
[im 96/101  soft-tissue]
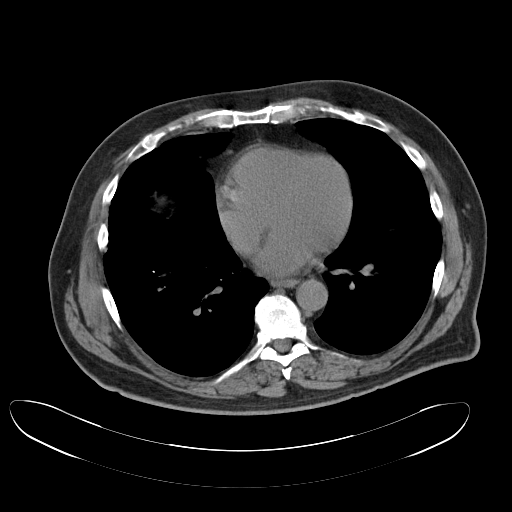
[im 96/101  lung]
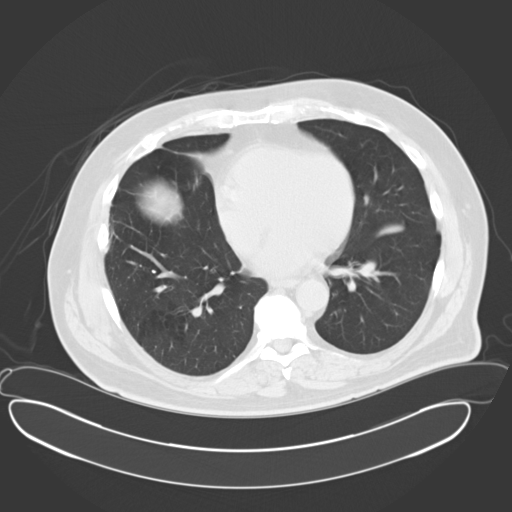

[13 of 32 positions shown; findings below may reference images not displayed]

FINDINGS: Lower chest: Emphysema. Volume loss and scarring peripherally in the
right lower lobe, in the right middle lobe, and posteriorly in the
lingula. Right coronary artery atherosclerotic calcification.

Hepatobiliary: Several small right hepatic lobe lesions occlude a 5
mm hypodensity anteriorly in the right hepatic lobe on image 20
series 2 and a 4 mm lesion along the dome of the right hepatic lobe
on image 18 series 2. These are technically nonspecific. Mildly
contracted gallbladder.

Pancreas: Cystic potentially partially exophytic 3.3 by 1.9 by
cm lesion along the uncinate process of the pancreas. Along the
upper margin of this lesion, there is a 5 mm punctate calcification
(image 39, series 2). There also some questionable punctate
calcifications along the inferior margin of the pancreatic head,
images 82-84 of series 5. In addition, there is an adjacent
periampullary duodenal diverticulum. It is difficult to follow the
distal common bile duct but I suspect that the calcification in
question is 2 medial to the within the CBD, although it could be in
the ventral pancreatic duct. There is no dilatation of the dorsal
pancreatic duct.

Spleen: Unremarkable

Adrenals/Urinary Tract: There is a 10 mm calcification along the
posterior margin of the urinary bladder, probably indolent
intraluminal although somewhat obscured by the streak artifact from
the adjacent right hip implant. This could be in the right UVJ,
intraluminal, or less likely extraluminal along the bladder wall.
There is no hydronephrosis or hydroureter and no other urinary tract
calculi are observed.

Stomach/Bowel: Duodenal diverticula in the periampullary and third
portion. Descending colon and sigmoid colon diverticulosis.

Vascular/Lymphatic: 1.2 cm left external iliac node, image 85 series
2. Additional bilateral external iliac nodes have large fatty hila.

Aortoiliac atherosclerotic vascular disease.

Reproductive: Small punctate calcification in the central prostate
the zone.

Other: No supplemental non-categorized findings.

Musculoskeletal: Atrophic right iliopsoas muscles. Atrophic right
hip adductor musculature. Right hip implant noted with the screw
fixators of the acetabular shell mostly projecting into the right
gluteus minimus muscle with minimal extension through the iliac
bone.

Intervertebral spurring at L4-5. Small central disc protrusion at
L5-S1. Moderate to prominent degenerative loss of articular space in
the left hip.

Small umbilical hernia contains omental adipose tissue. Several
adjacent supraumbilical hernias in the midline noted on images 39-42
of series 2, each with a 1.6 cm hernia neck, and with herniated
adipose tissue in this vicinity measuring approximately 4.2 by
by 8.2 cm.

There is a left upper quadrant ventral hernia extending below the
rib cage, 2 interposed itself between the external oblique muscle
and the rib cage as shown on image 33 series 2. This hernia contains
adipose tissue.
IMPRESSION: 1. 10 mm calcification along the right posterior margin of the
urinary bladder, probably intraluminal, possibly in the right UVJ.
There is no hydronephrosis to further suggest a UVJ location. Region
obscured by streak artifact from the patient's right hip implant.
2. 3.3 by 1.9 by 2.0 cm cystic lesion along the uncinate margin of
the pancreas with a marginal 5 mm calcification. Pancreatic protocol
MRI with and without contrast recommended for further assessment.
3. Multiple ventral hernias as detailed above. Atrophic right
iliopsoas muscles. Atrophic right hip adductor musculature.
4. The screw fixators of the acetabular shell component of the right
hip implant mostly project in the right gluteus minimus muscle, with
minimal extension through the iliac bone.
5. Moderate to prominent degenerative chondral thinning in the left
hip.
6. Mildly enlarged left external iliac node, nonspecific for
reactive versus neoplastic etiology.
7. Duodenal diverticula are present with 1 PICC periampullary in
location and the other along the third portion of the duodenum.
8. Emphysema.
9. Coronary atherosclerosis.
10. Several small right hepatic lobe hypodensities are statistically
likely to be benign but technically nonspecific.
11. Descending colon and sigmoid colon diverticulosis.

## 2018-01-26 ENCOUNTER — Telehealth: Payer: Self-pay | Admitting: Gastroenterology

## 2018-01-26 ENCOUNTER — Other Ambulatory Visit: Payer: Self-pay

## 2018-01-26 DIAGNOSIS — Z1211 Encounter for screening for malignant neoplasm of colon: Secondary | ICD-10-CM

## 2018-01-26 MED ORDER — PEG 3350-KCL-NA BICARB-NACL 420 G PO SOLR: 4000.0000 mL | Freq: Once | ORAL | 0 refills | Status: AC

## 2018-01-26 NOTE — Telephone Encounter (Signed)
LVM for pt to contact office.

## 2018-01-26 NOTE — Telephone Encounter (Signed)
Patient is returning a call to schedule a colonoscopy °

## 2018-01-30 ENCOUNTER — Other Ambulatory Visit: Payer: Self-pay

## 2018-01-30 MED ORDER — NA SULFATE-K SULFATE-MG SULF 17.5-3.13-1.6 GM/177ML PO SOLN: 1.0000 | Freq: Once | ORAL | 0 refills | Status: AC

## 2018-02-01 ENCOUNTER — Other Ambulatory Visit: Payer: Self-pay

## 2018-02-02 ENCOUNTER — Telehealth: Payer: Self-pay

## 2018-02-02 NOTE — Telephone Encounter (Signed)
Mr. Alex Beltran called to cancel his procedure scheduled for 6/18 due to emergency travel to family.

## 2018-02-06 ENCOUNTER — Ambulatory Visit: Admit: 2018-02-06 | Payer: Medicare HMO | Admitting: Gastroenterology

## 2018-02-06 SURGERY — COLONOSCOPY WITH PROPOFOL
Anesthesia: General

## 2019-07-03 IMAGING — CR DG HIP (WITH OR WITHOUT PELVIS) 2-3V*R*
1 series · 3 of 3 positions shown · non-contrast
Comparison: None.

CLINICAL DATA: Right hip pain

EXAM:
DG HIP (WITH OR WITHOUT PELVIS) 2-3V RIGHT

[Series 1: dg hip unilat w or w/o pelvis 2-3 views  · non-contrast · 0.14mm/px · 3 of 3 slices shown]
[im 1/3]
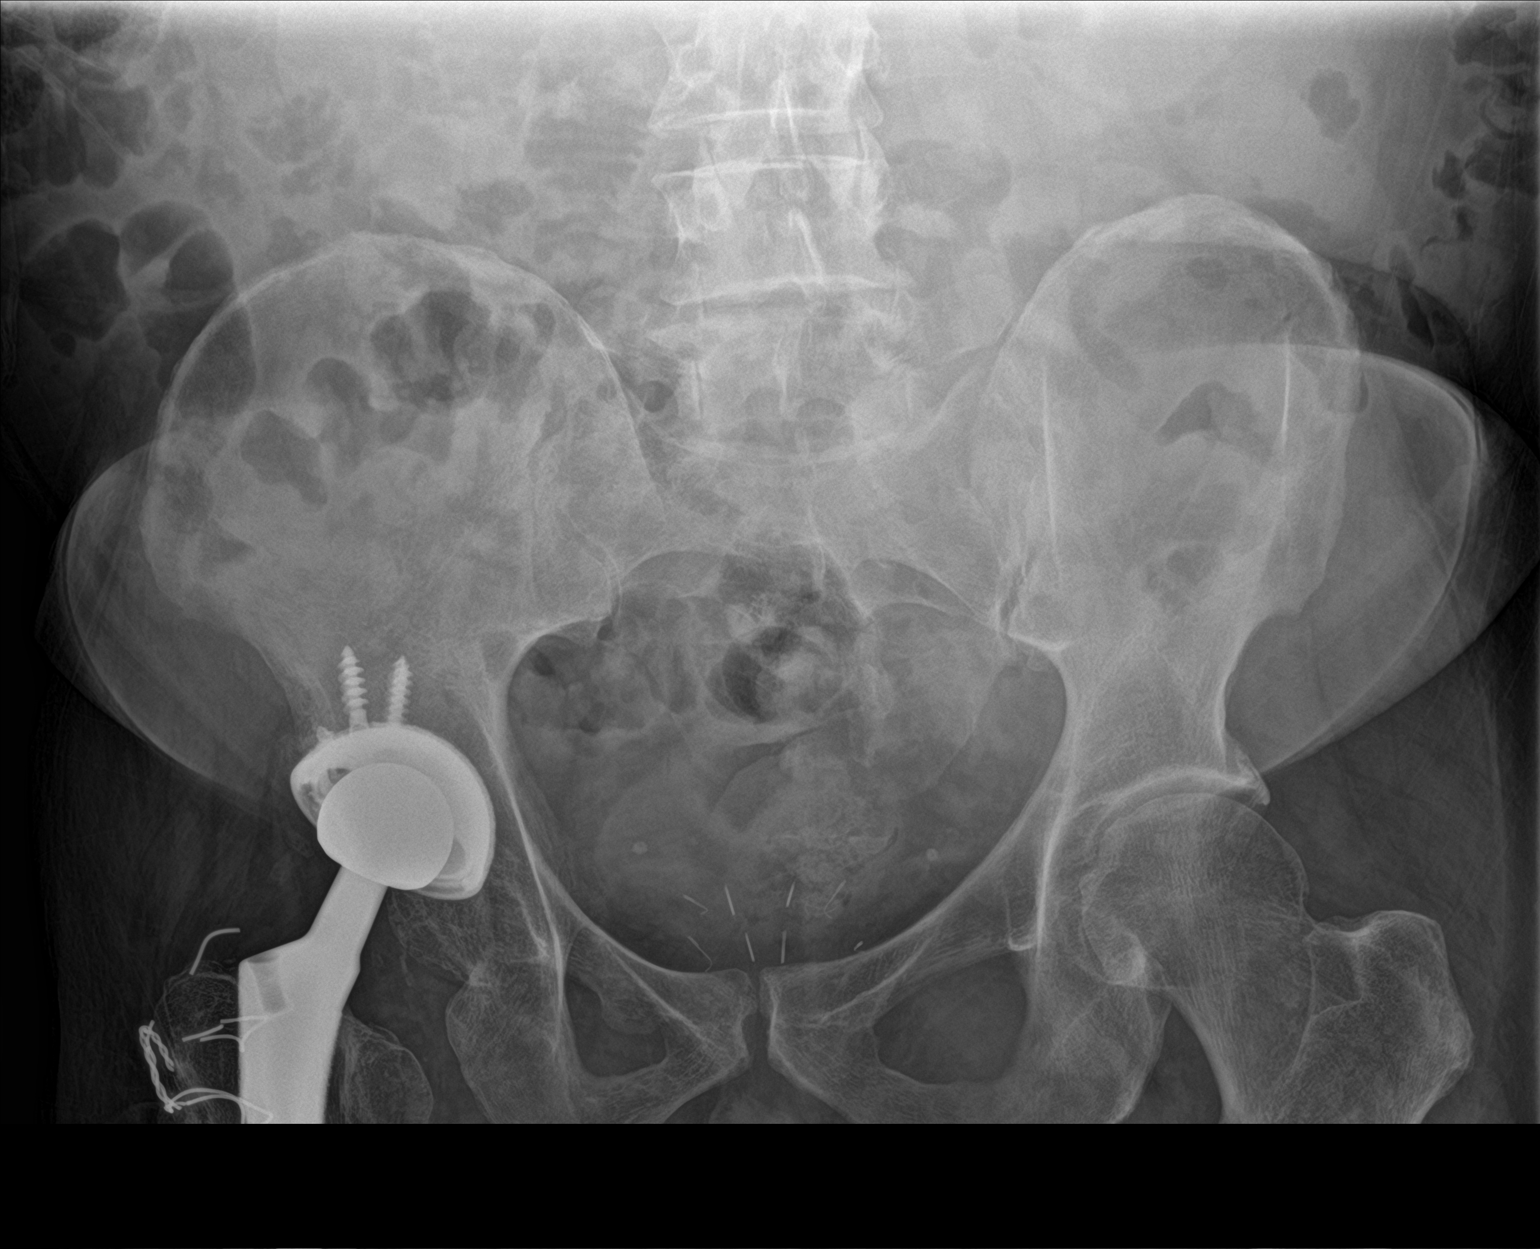
[im 2/3]
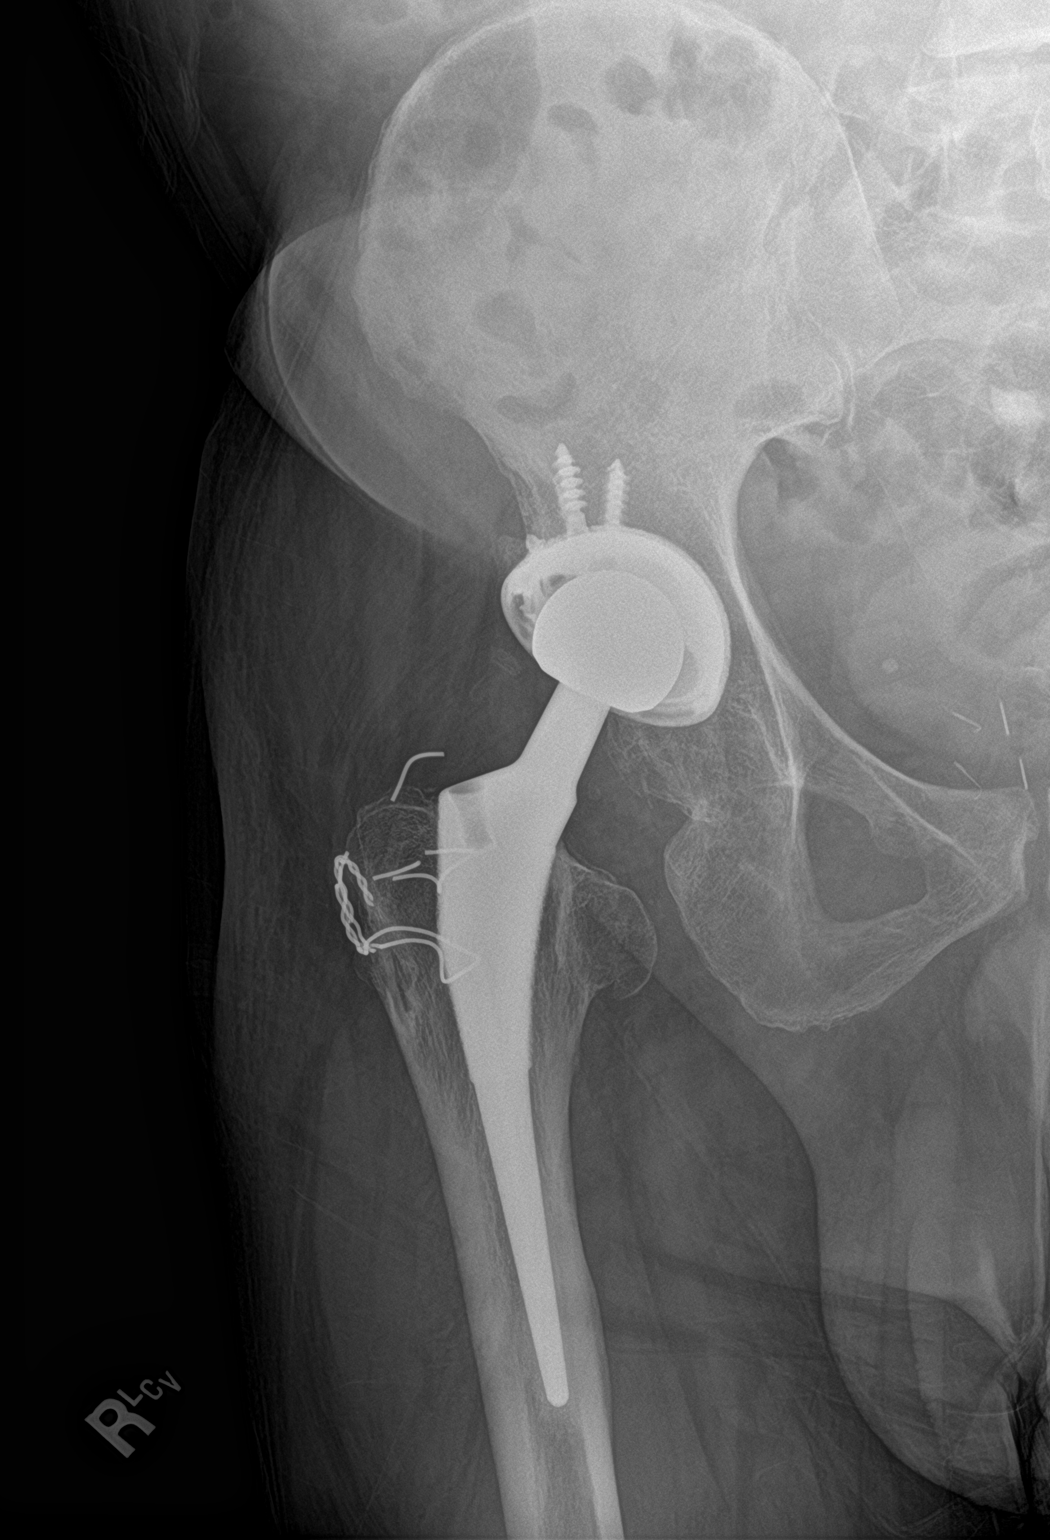
[im 3/3]
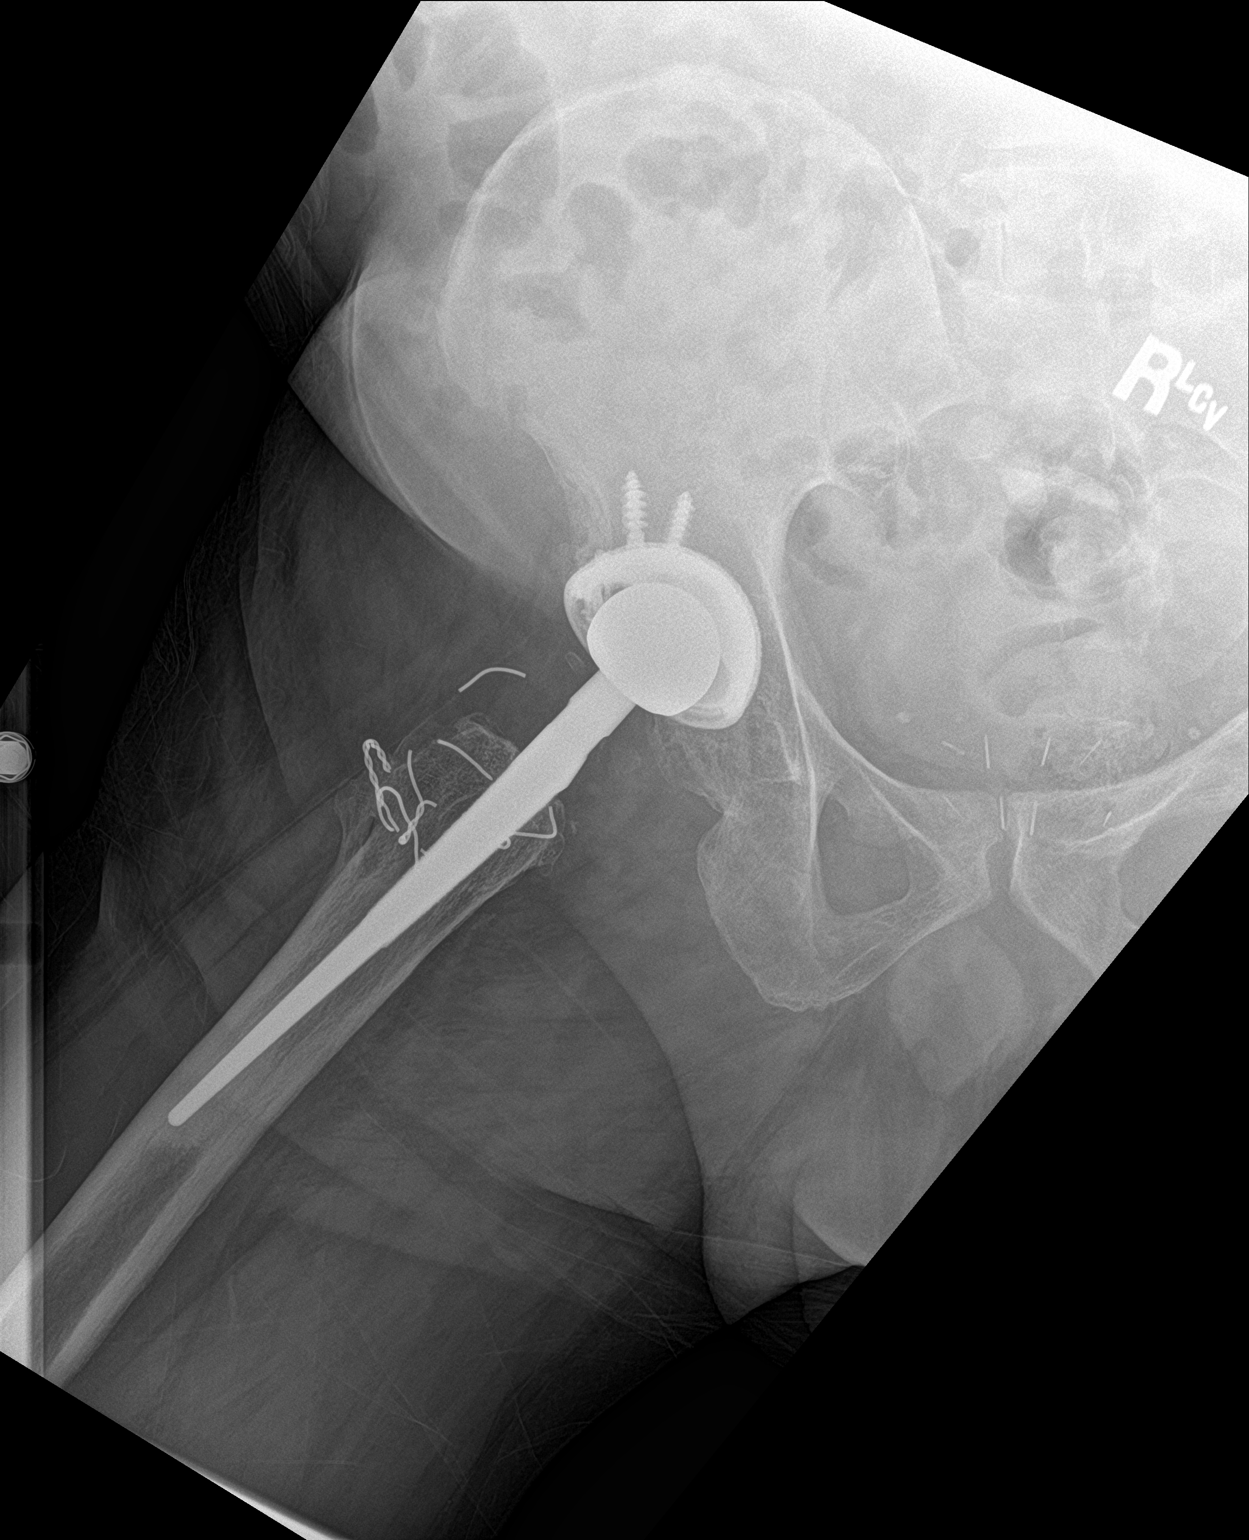

[3 of 3 positions shown; findings below may reference images not displayed]

FINDINGS: Pelvic ring is intact. Postsurgical changes are noted consistent
with prior hip replacement. No acute fracture or dislocation is
seen. Degenerative changes of left hip joint are noted. Changes of
prior Urolift placement are noted.
IMPRESSION: Postsurgical and degenerative changes.  No acute abnormality noted.

## 2019-08-27 ENCOUNTER — Ambulatory Visit: Payer: Medicare HMO | Attending: Internal Medicine

## 2019-08-27 DIAGNOSIS — Z20822 Contact with and (suspected) exposure to covid-19: Secondary | ICD-10-CM

## 2019-08-31 LAB — NOVEL CORONAVIRUS, NAA: SARS-CoV-2, NAA: NOT DETECTED

## 2020-11-12 ENCOUNTER — Encounter: Payer: Self-pay | Admitting: Ophthalmology

## 2020-11-12 ENCOUNTER — Other Ambulatory Visit: Payer: Self-pay

## 2020-11-16 ENCOUNTER — Other Ambulatory Visit: Payer: Self-pay

## 2020-11-16 ENCOUNTER — Other Ambulatory Visit
Admission: RE | Admit: 2020-11-16 | Discharge: 2020-11-16 | Disposition: A | Discharge status: Home / Self Care | Payer: Medicare HMO | Source: Ambulatory Visit | Attending: Ophthalmology | Admitting: Ophthalmology

## 2020-11-16 DIAGNOSIS — Z20822 Contact with and (suspected) exposure to covid-19: Secondary | ICD-10-CM | POA: Diagnosis not present

## 2020-11-16 DIAGNOSIS — Z01812 Encounter for preprocedural laboratory examination: Secondary | ICD-10-CM | POA: Diagnosis present

## 2020-11-16 LAB — SARS CORONAVIRUS 2 (TAT 6-24 HRS): SARS Coronavirus 2: NEGATIVE

## 2020-11-16 NOTE — Discharge Instructions (Signed)

## 2020-11-18 ENCOUNTER — Other Ambulatory Visit: Payer: Self-pay

## 2020-11-18 ENCOUNTER — Ambulatory Visit: Payer: Medicare HMO | Admitting: Anesthesiology

## 2020-11-18 ENCOUNTER — Encounter
Admission: RE | Disposition: A | Discharge status: Home / Self Care | Payer: Self-pay | Source: Home / Self Care | Attending: Ophthalmology

## 2020-11-18 ENCOUNTER — Encounter: Payer: Self-pay | Admitting: Ophthalmology

## 2020-11-18 ENCOUNTER — Ambulatory Visit
Admission: RE | Admit: 2020-11-18 | Discharge: 2020-11-18 | Disposition: A | Discharge status: Home / Self Care | Payer: Medicare HMO | Attending: Ophthalmology | Admitting: Ophthalmology

## 2020-11-18 DIAGNOSIS — Z79899 Other long term (current) drug therapy: Secondary | ICD-10-CM | POA: Insufficient documentation

## 2020-11-18 DIAGNOSIS — Z7982 Long term (current) use of aspirin: Secondary | ICD-10-CM | POA: Diagnosis not present

## 2020-11-18 DIAGNOSIS — H2511 Age-related nuclear cataract, right eye: Secondary | ICD-10-CM | POA: Insufficient documentation

## 2020-11-18 DIAGNOSIS — Z96641 Presence of right artificial hip joint: Secondary | ICD-10-CM | POA: Diagnosis not present

## 2020-11-18 DIAGNOSIS — E1136 Type 2 diabetes mellitus with diabetic cataract: Secondary | ICD-10-CM | POA: Insufficient documentation

## 2020-11-18 DIAGNOSIS — Z794 Long term (current) use of insulin: Secondary | ICD-10-CM | POA: Diagnosis not present

## 2020-11-18 DIAGNOSIS — Z7984 Long term (current) use of oral hypoglycemic drugs: Secondary | ICD-10-CM | POA: Insufficient documentation

## 2020-11-18 HISTORY — PX: CATARACT EXTRACTION W/PHACO: SHX586

## 2020-11-18 HISTORY — DX: Blepharospasm: G24.5

## 2020-11-18 LAB — GLUCOSE, CAPILLARY
Glucose-Capillary: 118 mg/dL — ABNORMAL HIGH (ref 70–99)
Glucose-Capillary: 135 mg/dL — ABNORMAL HIGH (ref 70–99)

## 2020-11-18 SURGERY — PHACOEMULSIFICATION, CATARACT, WITH IOL INSERTION
Anesthesia: Monitor Anesthesia Care | Site: Eye | Laterality: Right

## 2020-11-18 MED ORDER — ARMC OPHTHALMIC DILATING DROPS
1.0000 "application " | OPHTHALMIC | Status: DC | PRN
  Administered 2020-11-18 (×3): 1 via OPHTHALMIC

## 2020-11-18 MED ORDER — ACETAMINOPHEN 160 MG/5ML PO SOLN: 325.0000 mg | ORAL | Status: DC | PRN

## 2020-11-18 MED ORDER — CEFUROXIME OPHTHALMIC INJECTION 1 MG/0.1 ML
INJECTION | OPHTHALMIC | Status: DC | PRN
  Administered 2020-11-18: 0.1 mL via INTRACAMERAL

## 2020-11-18 MED ORDER — FENTANYL CITRATE (PF) 100 MCG/2ML IJ SOLN
INTRAMUSCULAR | Status: DC | PRN
  Administered 2020-11-18 (×2): 50 ug via INTRAVENOUS

## 2020-11-18 MED ORDER — NA HYALUR & NA CHOND-NA HYALUR 0.4-0.35 ML IO KIT
PACK | INTRAOCULAR | Status: DC | PRN
  Administered 2020-11-18: 1 mL via INTRAOCULAR

## 2020-11-18 MED ORDER — ONDANSETRON HCL 4 MG/2ML IJ SOLN: 4.0000 mg | Freq: Once | INTRAMUSCULAR | Status: DC | PRN

## 2020-11-18 MED ORDER — ACETAMINOPHEN 325 MG PO TABS: 325.0000 mg | ORAL_TABLET | ORAL | Status: DC | PRN

## 2020-11-18 MED ORDER — LIDOCAINE HCL (PF) 2 % IJ SOLN
INTRAOCULAR | Status: DC | PRN
  Administered 2020-11-18: 2 mL

## 2020-11-18 MED ORDER — EPINEPHRINE PF 1 MG/ML IJ SOLN
INTRAOCULAR | Status: DC | PRN
  Administered 2020-11-18: 78 mL via OPHTHALMIC

## 2020-11-18 MED ORDER — MIDAZOLAM HCL 2 MG/2ML IJ SOLN
INTRAMUSCULAR | Status: DC | PRN
  Administered 2020-11-18 (×2): 1 mg via INTRAVENOUS

## 2020-11-18 MED ORDER — BRIMONIDINE TARTRATE-TIMOLOL 0.2-0.5 % OP SOLN
OPHTHALMIC | Status: DC | PRN
  Administered 2020-11-18: 1 [drp] via OPHTHALMIC

## 2020-11-18 MED ORDER — TETRACAINE HCL 0.5 % OP SOLN
1.0000 [drp] | OPHTHALMIC | Status: DC | PRN
  Administered 2020-11-18 (×3): 1 [drp] via OPHTHALMIC

## 2020-11-18 SURGICAL SUPPLY — 19 items
CANNULA ANT/CHMB 27GA (MISCELLANEOUS) ×2 IMPLANT
GLOVE EXAM LX STRL 7.5 (GLOVE) ×2 IMPLANT
GLOVE SURG TRIUMPH 8.0 PF LTX (GLOVE) ×2 IMPLANT
GOWN STRL REUS W/ TWL LRG LVL3 (GOWN DISPOSABLE) ×2 IMPLANT
GOWN STRL REUS W/TWL LRG LVL3 (GOWN DISPOSABLE) ×4
LENS IOL DIOP 19.5 (Intraocular Lens) ×2 IMPLANT
LENS IOL TECNIS MONO 19.5 (Intraocular Lens) ×1 IMPLANT
MARKER SKIN DUAL TIP RULER LAB (MISCELLANEOUS) ×2 IMPLANT
NEEDLE CAPSULORHEX 25GA (NEEDLE) ×2 IMPLANT
NEEDLE FILTER BLUNT 18X 1/2SAF (NEEDLE) ×2
NEEDLE FILTER BLUNT 18X1 1/2 (NEEDLE) ×2 IMPLANT
PACK CATARACT BRASINGTON (MISCELLANEOUS) ×2 IMPLANT
PACK EYE AFTER SURG (MISCELLANEOUS) ×2 IMPLANT
PACK OPTHALMIC (MISCELLANEOUS) ×2 IMPLANT
SOLUTION OPHTHALMIC SALT (MISCELLANEOUS) ×2 IMPLANT
SYR 3ML LL SCALE MARK (SYRINGE) ×4 IMPLANT
SYR TB 1ML LUER SLIP (SYRINGE) ×2 IMPLANT
WATER STERILE IRR 250ML POUR (IV SOLUTION) ×2 IMPLANT
WIPE NON LINTING 3.25X3.25 (MISCELLANEOUS) ×2 IMPLANT

## 2020-11-18 NOTE — Anesthesia Postprocedure Evaluation (Signed)
Anesthesia Post Note  Patient: Alex Beltran  Procedure(s) Performed: CATARACT EXTRACTION PHACO AND INTRAOCULAR LENS PLACEMENT (IOC) RIGHT DIABETIC 5.64 01:07.4 8.4% (Right Eye)     Patient location during evaluation: PACU Anesthesia Type: MAC Level of consciousness: awake Pain management: pain level controlled Vital Signs Assessment: post-procedure vital signs reviewed and stable Respiratory status: respiratory function stable Cardiovascular status: stable Postop Assessment: no apparent nausea or vomiting Anesthetic complications: no   No complications documented.  Veda Canning

## 2020-11-18 NOTE — Anesthesia Procedure Notes (Signed)
Procedure Name: MAC Date/Time: 11/18/2020 10:10 AM Performed by: Vanetta Shawl, CRNA Pre-anesthesia Checklist: Patient identified, Emergency Drugs available, Suction available, Timeout performed and Patient being monitored Patient Re-evaluated:Patient Re-evaluated prior to induction Oxygen Delivery Method: Nasal cannula Placement Confirmation: positive ETCO2

## 2020-11-18 NOTE — Transfer of Care (Signed)
Immediate Anesthesia Transfer of Care Note  Patient: Alex Beltran  Procedure(s) Performed: CATARACT EXTRACTION PHACO AND INTRAOCULAR LENS PLACEMENT (IOC) RIGHT DIABETIC 5.64 01:07.4 8.4% (Right Eye)  Patient Location: PACU  Anesthesia Type: MAC  Level of Consciousness: awake, alert  and patient cooperative  Airway and Oxygen Therapy: Patient Spontanous Breathing   Post-op Assessment: Post-op Vital signs reviewed, Patient's Cardiovascular Status Stable, Respiratory Function Stable, Patent Airway and No signs of Nausea or vomiting  Post-op Vital Signs: Reviewed and stable  Complications: No complications documented.

## 2020-11-18 NOTE — Op Note (Signed)
LOCATION:  [facility]   PREOPERATIVE DIAGNOSIS:    Nuclear sclerotic cataract right eye. H25.11   POSTOPERATIVE DIAGNOSIS:  Nuclear sclerotic cataract right eye.     PROCEDURE:  Phacoemusification with posterior chamber intraocular lens placement of the right eye   ULTRASOUND TIME: Procedure(s): CATARACT EXTRACTION PHACO AND INTRAOCULAR LENS PLACEMENT (IOC) RIGHT DIABETIC 5.64 01:07.4 8.4% (Right)  LENS:   Implant Name Type Inv. Item Serial No. Manufacturer Lot No. LRB No. Used Action  LENS IOL DIOP 19.5 - [id number] Intraocular Lens LENS IOL DIOP 19.5 [id number] JOHNSON   Right 1 Implanted         SURGEON:  Deirdre Evener, MD   ANESTHESIA:  Topical with tetracaine drops and 2% Xylocaine jelly, augmented with 1% preservative-free intracameral lidocaine.    COMPLICATIONS:  None.   DESCRIPTION OF PROCEDURE:  The patient was identified in the holding room and transported to the operating room and placed in the supine position under the operating microscope.  The right eye was identified as the operative eye and it was prepped and draped in the usual sterile ophthalmic fashion.   A 1 millimeter clear-corneal paracentesis was made at the 12:00 position.  0.5 ml of preservative-free 1% lidocaine was injected into the anterior chamber. The anterior chamber was filled with Viscoat viscoelastic.  A 2.4 millimeter keratome was used to make a near-clear corneal incision at the 9:00 position.  A curvilinear capsulorrhexis was made with a cystotome and capsulorrhexis forceps.  Balanced salt solution was used to hydrodissect and hydrodelineate the nucleus.   Phacoemulsification was then used in stop and chop fashion to remove the lens nucleus and epinucleus.  The remaining cortex was then removed using the irrigation and aspiration handpiece. Provisc was then placed into the capsular bag to distend it for lens placement.  A lens was then injected into the capsular bag.  The  remaining viscoelastic was aspirated.   Wounds were hydrated with balanced salt solution.  The anterior chamber was inflated to a physiologic pressure with balanced salt solution.  No wound leaks were noted. Cefuroxime 0.1 ml of a 10mg /ml solution was injected into the anterior chamber for a dose of 1 mg of intracameral antibiotic at the completion of the case.   Timolol and Brimonidine drops were applied to the eye.  The patient was taken to the recovery room in stable condition without complications of anesthesia or surgery.   Emile Ringgenberg 11/18/2020, 10:36 AM

## 2020-11-18 NOTE — Anesthesia Preprocedure Evaluation (Signed)
Anesthesia Evaluation  Patient identified by MRN, date of birth, ID band Patient awake    Reviewed: Allergy & Precautions, NPO status   Airway Mallampati: II  TM Distance: >3 FB     Dental   Pulmonary former smoker (60 pack years - quit 2015),    Pulmonary exam normal        Cardiovascular hypertension,  Rhythm:Regular Rate:Normal  HLD   Neuro/Psych    GI/Hepatic   Endo/Other  diabetes, Type 2, Insulin DependentObesity - BMI 31  Renal/GU      Musculoskeletal  (+) Arthritis ,   Abdominal   Peds  Hematology   Anesthesia Other Findings   Reproductive/Obstetrics                             Anesthesia Physical Anesthesia Plan  ASA: III  Anesthesia Plan: MAC   Post-op Pain Management:    Induction: Intravenous  PONV Risk Score and Plan: TIVA, Midazolam and Treatment may vary due to age or medical condition  Airway Management Planned: Natural Airway and Nasal Cannula  Additional Equipment:   Intra-op Plan:   Post-operative Plan:   Informed Consent: I have reviewed the patients History and Physical, chart, labs and discussed the procedure including the risks, benefits and alternatives for the proposed anesthesia with the patient or authorized representative who has indicated his/her understanding and acceptance.       Plan Discussed with: CRNA  Anesthesia Plan Comments:         Anesthesia Quick Evaluation

## 2020-11-18 NOTE — H&P (Signed)
[facility]   Primary Care Physician:  Letta Median, MD Ophthalmologist: Dr. Leandrew Koyanagi  Pre-Procedure History & Physical: HPI:  Alex Beltran is a 65 y.o. male here for ophthalmic surgery.   Past Medical History:  Diagnosis Date  . Arthritis   . Blind left eye   . BPH (benign prostatic hyperplasia)   . Chronic pain syndrome 03/26/2017  . Complication of anesthesia    slow to awaken with last surgery  . Diabetes mellitus ([place])   . Eye twitch    Pt reports eyes twitch if he lays on spot on head where bone was removed.  . Frequent urination   . History of bladder infections since nov 2016  . History of kidney stones    yearly for last 5 years  . HLD (hyperlipidemia)   . Joint pain   . Reflux    hx of  . Skin irritation    RIGHT CHEST SMALL RED AREA PT TO CALL DR MCKENZIE AND MAKE AWARE OF    Past Surgical History:  Procedure Laterality Date  . BLADDER SURGERY  2013, 14, 15, 16   stones  . 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 CYSTOSCOPY WITH INSERTION OF UROLIFT N/A 02/04/2016   Procedure: CYSTOSCOPY WITH INSERTION OF UROLIFT;  Surgeon: Cleon Gustin, MD;  Location: [facility];  Service: Urology;  Laterality: N/A;  . CYSTOSCOPY WITH LITHOLAPAXY N/A 02/04/2016   Procedure: CYSTOSCOPY WITH LITHOLAPAXY;  Surgeon: Cleon Gustin, MD;  Location: [facility];  Service: Urology;  Laterality: N/A;  . HOLMIUM LASER APPLICATION N/A 1/32/4401   Procedure: HOLMIUM LASER APPLICATION;  Surgeon: Cleon Gustin, MD;  Location: [facility];  Service: Urology;  Laterality: N/A;  . mass removed from right hip as child    . nissan fundoplication  0272  . TOTAL HIP ARTHROPLASTY Right 2009    Prior to Admission medications   Medication Sig Start Date End Date Taking? Authorizing Provider  aspirin EC 81 MG tablet Take 81 mg by mouth daily.   Yes [provider]  B-D UF III MINI PEN  NEEDLES 31G X 5 MM MISC USE ONCE D 11/13/17  Yes [provider]  Blood Glucose Monitoring Suppl (ONETOUCH VERIO) w/Device KIT USE AS DIRECTED TO CHECK BLOOD SUGAR 01/23/18  Yes [provider]  ibuprofen (ADVIL,MOTRIN) 200 MG tablet Take 200 mg by mouth every 4 (four) hours as needed.   Yes [provider]  insulin aspart (NOVOLOG) 100 UNIT/ML injection Inject 10 Units into the skin 3 (three) times daily before meals.   Yes [provider]  insulin glargine (LANTUS) 100 UNIT/ML injection Inject 50 Units into the skin at bedtime. (11/12/20 - 32 units AM  37 units PM)   Yes [provider]  liraglutide (VICTOZA) 18 MG/3ML SOPN Inject 1.2 mg into the skin.   Yes [provider]  lisinopril (PRINIVIL,ZESTRIL) 10 MG tablet Take 10 mg by mouth daily.   Yes [provider]  metFORMIN (GLUCOPHAGE) 1000 MG tablet Take 1,000 mg by mouth 2 (two) times daily.    Yes [provider]  ONETOUCH VERIO test strip USE AS DIRECTED TO CHECK BLOOD SUGARS 1 TO 3 TIMES PER DAY 01/23/18  Yes [provider]  rosuvastatin (CRESTOR) 20 MG tablet Take 20 mg by mouth every evening.    Yes [provider]  oxyCODONE-acetaminophen (  PERCOCET) 5-325 MG tablet Take 1-2 tablets by mouth once. Take 30 minutes before MRI for back pain. 06/07/17 06/07/17  Milinda Pointer, MD    Allergies as of 09/22/2020 - Review Complete 06/07/2017  Allergen Reaction Noted  . Morphine and related Nausea And Vomiting 10/07/2015  . Niacin and related Other (See Comments) 10/07/2015    Family History  Problem Relation Age of Onset  . Tuberculosis Mother   . Melanoma Mother     Social History   Socioeconomic History  . Marital status: Married    Spouse name: Not on file  . Number of children: Not on file  . Years of education: Not on file  . Highest education level: Not on file  Occupational History  . Not on file  Tobacco Use  . Smoking status:  Former Smoker    Packs/day: 2.00    Years: 30.00    Pack years: 60.00    Types: Cigarettes    Quit date: 10/06/2013    Years since quitting: 7.1  . Smokeless tobacco: Never Used  Vaping Use  . Vaping Use: Never used  Substance and Sexual Activity  . Alcohol use: Yes    Alcohol/week: 0.0 standard drinks    Comment: occ  . Drug use: Yes    Types: Marijuana    Comment: marijuana last used for pain control 01-24-16 last use  . Sexual activity: Not on file  Other Topics Concern  . Not on file  Social History Narrative  . Not on file   Social Determinants of Health   Financial Resource Strain: Not on file  Food Insecurity: Not on file  Transportation Needs: Not on file  Physical Activity: Not on file  Stress: Not on file  Social Connections: Not on file  Intimate Partner Violence: Not on file    Review of Systems: See HPI, otherwise negative ROS  Physical Exam: BP (!) 174/84   Pulse (!) 56   Temp (!) 96.9 F (36.1 C) (Temporal)   Ht 5' 7"  (1.702 m)   Wt 92.1 kg   SpO2 97%   BMI 31.79 kg/m  General:   Alert,  pleasant and cooperative in NAD Head:  Normocephalic and atraumatic. Lungs:  Clear to auscultation.    Heart:  Regular rate and rhythm.   Impression/Plan: Alex Beltran is here for ophthalmic surgery.  Risks, benefits, limitations, and alternatives regarding ophthalmic surgery have been reviewed with the patient.  Questions have been answered.  All parties agreeable.   Leandrew Koyanagi, MD  11/18/2020, 9:39 AM

## 2020-11-19 ENCOUNTER — Encounter: Payer: Self-pay | Admitting: Ophthalmology
